# Patient Record
Sex: Male | Born: 1950 | Race: White | Hispanic: No | Marital: Married | Smoking: Current every day smoker
Health system: Southern US, Community
[De-identification: ages and names within clinical notes are randomized; demographics above are authoritative.]

## PROBLEM LIST (undated history)

## (undated) DIAGNOSIS — K219 Gastro-esophageal reflux disease without esophagitis: Secondary | ICD-10-CM

## (undated) DIAGNOSIS — F329 Major depressive disorder, single episode, unspecified: Secondary | ICD-10-CM

## (undated) DIAGNOSIS — F17200 Nicotine dependence, unspecified, uncomplicated: Secondary | ICD-10-CM

## (undated) DIAGNOSIS — I10 Essential (primary) hypertension: Secondary | ICD-10-CM

## (undated) HISTORY — DX: Major depressive disorder, single episode, unspecified: F32.9

## (undated) HISTORY — DX: Gastro-esophageal reflux disease without esophagitis: K21.9

## (undated) HISTORY — DX: Essential (primary) hypertension: I10

## (undated) HISTORY — DX: Nicotine dependence, unspecified, uncomplicated: F17.200

---

## 2001-11-09 LAB — HM SIGMOIDOSCOPY

## 2008-05-02 ENCOUNTER — Emergency Department (HOSPITAL_COMMUNITY): Admission: EM | Admit: 2008-05-02 | Discharge: 2008-05-02 | Payer: Self-pay | Admitting: Emergency Medicine

## 2010-02-05 ENCOUNTER — Ambulatory Visit: Payer: Self-pay | Admitting: Internal Medicine

## 2010-02-05 DIAGNOSIS — I10 Essential (primary) hypertension: Secondary | ICD-10-CM | POA: Insufficient documentation

## 2010-02-05 DIAGNOSIS — F329 Major depressive disorder, single episode, unspecified: Secondary | ICD-10-CM

## 2010-02-05 DIAGNOSIS — K219 Gastro-esophageal reflux disease without esophagitis: Secondary | ICD-10-CM | POA: Insufficient documentation

## 2010-02-05 DIAGNOSIS — F3341 Major depressive disorder, recurrent, in partial remission: Secondary | ICD-10-CM

## 2010-02-05 DIAGNOSIS — F3289 Other specified depressive episodes: Secondary | ICD-10-CM

## 2010-02-05 HISTORY — DX: Major depressive disorder, single episode, unspecified: F32.9

## 2010-02-05 HISTORY — DX: Gastro-esophageal reflux disease without esophagitis: K21.9

## 2010-02-05 HISTORY — DX: Other specified depressive episodes: F32.89

## 2010-02-05 HISTORY — DX: Essential (primary) hypertension: I10

## 2010-02-05 LAB — CONVERTED CEMR LAB
AST: 34 units/L (ref 0–37)
BUN: 15 mg/dL (ref 6–23)
Basophils Absolute: 0 10*3/uL (ref 0.0–0.1)
Cholesterol: 196 mg/dL (ref 0–200)
Eosinophils Absolute: 0.2 10*3/uL (ref 0.0–0.7)
GFR calc non Af Amer: 81.56 mL/min (ref >60)
Glucose, Bld: 80 mg/dL (ref 70–99)
HCT: 55.7 % — ABNORMAL HIGH (ref 39.0–52.0)
HDL: 35.8 mg/dL — ABNORMAL LOW (ref >39.00)
Lymphs Abs: 1.3 10*3/uL (ref 0.7–4.0)
MCHC: 33.1 g/dL (ref 30.0–36.0)
Monocytes Absolute: 0.6 10*3/uL (ref 0.1–1.0)
Monocytes Relative: 10.4 % (ref 3.0–12.0)
PSA: 1.87 ng/mL (ref 0.10–4.00)
Platelets: 200 10*3/uL (ref 150.0–400.0)
Potassium: 4.1 meq/L (ref 3.5–5.1)
RDW: 13.5 % (ref 11.5–14.6)
TSH: 0.74 microintl units/mL (ref 0.35–5.50)
Total Bilirubin: 0.8 mg/dL (ref 0.3–1.2)
VLDL: 32.2 mg/dL (ref 0.0–40.0)

## 2010-03-10 ENCOUNTER — Ambulatory Visit: Payer: Self-pay | Admitting: Internal Medicine

## 2010-08-06 ENCOUNTER — Telehealth: Payer: Self-pay | Admitting: Internal Medicine

## 2010-09-15 ENCOUNTER — Ambulatory Visit: Payer: Self-pay | Admitting: Internal Medicine

## 2010-09-15 DIAGNOSIS — F17200 Nicotine dependence, unspecified, uncomplicated: Secondary | ICD-10-CM | POA: Insufficient documentation

## 2010-09-15 HISTORY — DX: Nicotine dependence, unspecified, uncomplicated: F17.200

## 2010-12-09 NOTE — Assessment & Plan Note (Signed)
Summary: 6 month fup//ccm   Vital Signs:  Patient profile:   60 year old male Weight:      187 pounds Temp:     98.0 degrees F oral BP sitting:   112 / 80  (right arm) Cuff size:   regular  Vitals Entered By: Duard Brady LPN (September 15, 2010 3:49 PM) CC: 6 mos rov - doing well  **declines flu vaccine Is Patient Diabetic? No   CC:  6 mos rov - doing well  **declines flu vaccine.  History of Present Illness: 60 year old patient who is seen today for follow-up.  He has a history of hypertension, ongoing tobacco use, and depression.  He is doing quite well.  Laboratory studies were done last spring and revealed polycythemia.  He continues to smoke.  He is asymptomatic.  Denies any pulmonary  complains  Preventive Screening-Counseling & Management  Alcohol-Tobacco     Smoking Cessation Counseling: yes  Allergies: 1)  ! Compazine  Past History:  Past Medical History: Reviewed history from 02/05/2010 and no changes required. Depression GERD Hypertension  Family History: Reviewed history from 02/05/2010 and no changes required. father died age 60, probable acute MI, history of PAD mother, age 57 mild cognitive dysfunction one sisters in good health maternal grandmother history of colon cancer paternal grandfather history of coronary artery disease  Social History: Reviewed history from 02/05/2010 and no changes required. 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  No regular exercise program one half pack per day smoker  Review of Systems  The patient denies anorexia, fever, weight loss, weight gain, vision loss, decreased hearing, hoarseness, chest pain, syncope, dyspnea on exertion, peripheral edema, prolonged cough, headaches, hemoptysis, abdominal pain, melena, hematochezia, severe indigestion/heartburn, hematuria, incontinence, genital sores, muscle weakness, suspicious skin lesions, transient  blindness, difficulty walking, depression, unusual weight change, abnormal bleeding, enlarged lymph nodes, angioedema, breast masses, and testicular masses.    Physical Exam  General:  Well-developed,well-nourished,in no acute distress; alert,appropriate and cooperative throughout examination Head:  Normocephalic and atraumatic without obvious abnormalities. No apparent alopecia or balding. Eyes:  No corneal or conjunctival inflammation noted. EOMI. Perrla. Funduscopic exam benign, without hemorrhages, exudates or papilledema. Vision grossly normal. Mouth:  Oral mucosa and oropharynx without lesions or exudates.  Teeth in good repair. Neck:  No deformities, masses, or tenderness noted. Lungs:  Normal respiratory effort, chest expands symmetrically. Lungs are clear to auscultation, no crackles or wheezes. Heart:  Normal rate and regular rhythm. S1 and S2 normal without gallop, murmur, click, rub or other extra sounds. Abdomen:  Bowel sounds positive,abdomen soft and non-tender without masses, organomegaly or hernias noted. Msk:  No deformity or scoliosis noted of thoracic or lumbar spine.   Pulses:  R and L carotid,radial,femoral,dorsalis pedis and posterior tibial pulses are full and equal bilaterally Extremities:  No clubbing, cyanosis, edema, or deformity noted with normal full range of motion of all joints.     Impression & Recommendations:  Problem # 1:  HYPERTENSION (ICD-401.9)  His updated medication list for this problem includes:    Lisinopril-hydrochlorothiazide 20-25 Mg Tabs (Lisinopril-hydrochlorothiazide) ..... Qd    Atenolol 50 Mg Tabs (Atenolol) .Marland Kitchen... 2 qd  His updated medication list for this problem includes:    Lisinopril-hydrochlorothiazide 20-25 Mg Tabs (Lisinopril-hydrochlorothiazide) ..... Qd    Atenolol 50 Mg Tabs (Atenolol) .Marland Kitchen... 2 qd  Problem # 2:  GERD (ICD-530.81)  His updated medication list for this problem includes:    Omeprazole  20 Mg Cpdr (Omeprazole)  ..... One daily  His updated medication list for this problem includes:    Omeprazole 20 Mg Cpdr (Omeprazole) ..... One daily  Problem # 3:  DEPRESSION (ICD-311)  His updated medication list for this problem includes:    Sertraline Hcl 50 Mg Tabs (Sertraline hcl) ..... Qd  His updated medication list for this problem includes:    Sertraline Hcl 50 Mg Tabs (Sertraline hcl) ..... Qd  Problem # 4:  TOBACCO ABUSE (ICD-305.1)  Complete Medication List: 1)  Lisinopril-hydrochlorothiazide 20-25 Mg Tabs (Lisinopril-hydrochlorothiazide) .... Qd 2)  Atenolol 50 Mg Tabs (Atenolol) .... 2 qd 3)  Sertraline Hcl 50 Mg Tabs (Sertraline hcl) .... Qd 4)  Omeprazole 20 Mg Cpdr (Omeprazole) .... One daily  Patient Instructions: 1)  Please schedule a follow-up appointment in 6 months for annual exam 2)  Limit your Sodium (Salt). 3)  Tobacco is very bad for your health and your loved ones! You Should stop smoking!. 4)  It is important that you exercise regularly at least 20 minutes 5 times a week. If you develop chest pain, have severe difficulty breathing, or feel very tired , stop exercising immediately and seek medical attention. Prescriptions: OMEPRAZOLE 20 MG CPDR (OMEPRAZOLE) one daily  #90 x 4   Entered and Authorized by:   Gordy Savers  MD   Signed by:   Gordy Savers  MD on 09/15/2010   Method used:   Electronically to        [facility] (retail)       [address] [place], [place]  [address]       Ph: [phone]       Fax: [phone]   RxID:   [id number] SERTRALINE HCL 50 MG TABS (SERTRALINE HCL) qd  #90 x 4   Entered and Authorized by:   Gordy Savers  MD   Signed by:   Gordy Savers  MD on 09/15/2010   Method used:   Electronically to        [facility] (retail)       [address]       [place], [place]  [address]       Ph: [phone]       Fax: [phone]   RxID:   [id number] ATENOLOL 50 MG TABS (ATENOLOL) 2 qd  #90  x 4   Entered and Authorized by:   Gordy Savers  MD   Signed by:   Gordy Savers  MD on 09/15/2010   Method used:   Electronically to        [facility] (ret[facility])       [address]       [place], [place]  [address]       Ph: [phone]       Fax: [phone]   RxID:   [id number] LISINOPRIL-HYDROCHLOROTHIAZIDE 20-25 MG TABS (LISINOPRIL-HYDROCHLOROTHIAZIDE) qd  #90 x 4   Entered and Authorized by:   Gordy Savers  MD   Signed by:   Gordy Savers  MD on 09/15/2010   Method used:   Electronically to        [facility] (ret[facility])       [address] [place], [place]  [address]       Ph: [phone]  Fax: [phone]   RxID:   [id number]    Orders Added: 1)  Est. Patient Level III [[address]]

## 2010-12-09 NOTE — Assessment & Plan Note (Signed)
Summary: 1 MONTH F/U .Marland KitchenALP   Vital Signs:  Patient profile:   60 year old male Weight:      189 pounds Temp:     98.2 degrees F oral BP sitting:   110 / 68  (right arm) Cuff size:   regular  Vitals Entered By: Duard Brady LPN (Mar 11, 2951 4:09 PM) CC: rov - f/u Bp - much better Is Patient Diabetic? No   CC:  rov - f/u Bp - much better.  History of Present Illness: 60 year old patient who is in today for follow-up of his hypertension.  He was resumed on his medications one month ago and feels quite well.  Today.  No concerns or complaints.  He has a history of depression, which has been well-controlled on sertraline.  He has gastroesophageal  reflux disease controlled on omeprazole  Allergies: 1)  ! Compazine  Past History:  Past Medical History: Reviewed history from 02/05/2010 and no changes required. Depression GERD Hypertension  Physical Exam  General:  Well-developed,well-nourished,in no acute distress; alert,appropriate and cooperative throughout examination; blood pressure low normal range   Impression & Recommendations:  Problem # 1:  HYPERTENSION (ICD-401.9)  His updated medication list for this problem includes:    Lisinopril-hydrochlorothiazide 20-25 Mg Tabs (Lisinopril-hydrochlorothiazide) ..... Qd    Atenolol 50 Mg Tabs (Atenolol) .Marland Kitchen... 2 qd  Complete Medication List: 1)  Lisinopril-hydrochlorothiazide 20-25 Mg Tabs (Lisinopril-hydrochlorothiazide) .... Qd 2)  Atenolol 50 Mg Tabs (Atenolol) .... 2 qd 3)  Sertraline Hcl 50 Mg Tabs (Sertraline hcl) .... Qd 4)  Omeprazole 20 Mg Cpdr (Omeprazole) .... One daily  Patient Instructions: 1)  Limit your Sodium (Salt). 2)  It is important that you exercise regularly at least 20 minutes 5 times a week. If you develop chest pain, have severe difficulty breathing, or feel very tired , stop exercising immediately and seek medical attention. 3)  Check your Blood Pressure regularly. If it is above: 160/90 you  should make an appointment. 4)  Please schedule a follow-up appointment in 6 months.

## 2010-12-09 NOTE — Progress Notes (Signed)
Summary: refill atenolol  Phone Note Refill Request Message from:  Fax from Pharmacy on August 06, 2010 10:49 AM  Refills Requested: Medication #1:  ATENOLOL 50 MG TABS 2 qd [facility]   Method Requested: Fax to Local Pharmacy Initial call taken by: Duard Brady LPN,  August 06, 2010 10:50 AM    Prescriptions: ATENOLOL 50 MG TABS (ATENOLOL) 2 qd  #90 x 2   Entered by:   Duard Brady LPN   Authorized by:   Gordy Savers  MD   Signed by:   Duard Brady LPN on 09/81/1914   Method used:   Faxed to ...       [facility] ([facility])       [address] [place], [place]  [address]       Ph: [phone]       Fax: [phone]   RxID:   [id number]

## 2010-12-09 NOTE — Assessment & Plan Note (Signed)
Summary: NEW PT EST // RS   Vital Signs:  Patient profile:   60 year old male Height:      69.5 inches Weight:      195 pounds BMI:     28.49 Temp:     97.9 degrees F oral BP sitting:   190 / 100  (right arm) Cuff size:   regular  Vitals Entered By: Duard Brady LPN (February 05, 2010 8:51 AM) CC: new tp establish - needs refills on all meds Is Patient Diabetic? No   CC:  new tp establish - needs refills on all meds.  History of Present Illness: 60 year old patient who is seen today to establish with our practice;  he has a greater than 15 year history of hypertension and has been off of his medications.  he has a history of depression, which has been stable.  He is a one half pack per day smoker.  He denies any cardiopulmonary complaints.  Since being off his blood pressure medications, he  has had some pedal edema.  Denies any shortness of breath or dyspnea on exertion.  He has what sounds like a screening sigmoidoscopy about 8 years ago. He has a history of gastroesophageal reflux disease, which is controlled well with OTC Prilosec  Preventive Screening-Counseling & Management  Alcohol-Tobacco     Smoking Status: current     Smoking Cessation Counseling: yes  Allergies (verified): 1)  ! Compazine  Past History:  Past Medical History: Depression GERD Hypertension  Past Surgical History: none screening sigmoidoscopy at age 58  Family History: Reviewed history and no changes required. father died age 52, probable acute MI, history of PAD mother, age 57 mild cognitive dysfunction one sisters in good health maternal grandmother history of colon cancer paternal grandfather history of coronary artery disease  Social History: Reviewed history and no changes required. 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  No regular exercise program one half pack per day smokerSmoking Status:  current  Review of  Systems  The patient denies anorexia, fever, weight loss, weight gain, vision loss, decreased hearing, hoarseness, chest pain, syncope, dyspnea on exertion, peripheral edema, prolonged cough, headaches, hemoptysis, abdominal pain, melena, hematochezia, severe indigestion/heartburn, hematuria, incontinence, genital sores, muscle weakness, suspicious skin lesions, transient blindness, difficulty walking, depression, unusual weight change, abnormal bleeding, enlarged lymph nodes, angioedema, breast masses, and testicular masses.    Physical Exam  General:  Well-developed,well-nourished,in no acute distress; alert,appropriate and cooperative throughout examination; 190/100 Head:  Normocephalic and atraumatic without obvious abnormalities. No apparent alopecia or balding. Eyes:  No corneal or conjunctival inflammation noted. EOMI. Perrla. Funduscopic exam benign, without hemorrhages, exudates or papilledema. Vision grossly normal. Ears:  External ear exam shows no significant lesions or deformities.  Otoscopic examination reveals clear canals, tympanic membranes are intact bilaterally without bulging, retraction, inflammation or discharge. Hearing is grossly normal bilaterally. Nose:  External nasal examination shows no deformity or inflammation. Nasal mucosa are pink and moist without lesions or exudates. Mouth:  Oral mucosa and oropharynx without lesions or exudates.  Teeth in good repair. Neck:  No deformities, masses, or tenderness noted. Chest Wall:  No deformities, masses, tenderness or gynecomastia noted. Breasts:  No masses or gynecomastia noted Lungs:  Normal respiratory effort, chest expands symmetrically. Lungs are clear to auscultation, no crackles or wheezes. Heart:  Normal rate and regular rhythm. S1 and S2 normal without gallop, murmur, click, rub or other extra sounds. Abdomen:  Bowel  sounds positive,abdomen soft and non-tender without masses, organomegaly or hernias noted. Rectal:   perianal pedunculated lesion, consistent with a wart Genitalia:  Testes bilaterally descended without nodularity, tenderness or masses. No scrotal masses or lesions. No penis lesions or urethral discharge. Prostate:  2+ enlarged.   Msk:  No deformity or scoliosis noted of thoracic or lumbar spine.   Pulses:  R and L carotid,radial,femoral,dorsalis pedis and posterior tibial pulses are full and equal bilaterally Extremities:  2+ left pedal edema and 2+ right pedal edema.   Neurologic:  No cranial nerve deficits noted. Station and gait are normal. Plantar reflexes are down-going bilaterally. DTRs are symmetrical throughout. Sensory, motor and coordinative functions appear intact. Skin:  Intact without suspicious lesions or rashes Cervical Nodes:  No lymphadenopathy noted Axillary Nodes:  No palpable lymphadenopathy Inguinal Nodes:  No significant adenopathy Psych:  Cognition and judgment appear intact. Alert and cooperative with normal attention span and concentration. No apparent delusions, illusions, hallucinations   Impression & Recommendations:  Problem # 1:  HYPERTENSION (ICD-401.9)  His updated medication list for this problem includes:    Lisinopril-hydrochlorothiazide 20-25 Mg Tabs (Lisinopril-hydrochlorothiazide) ..... Qd    Atenolol 50 Mg Tabs (Atenolol) .Marland Kitchen... 2 qd  Orders: Venipuncture (91478) TLB-Lipid Panel (80061-LIPID) TLB-BMP (Basic Metabolic Panel-BMET) (80048-METABOL) TLB-CBC Platelet - w/Differential (85025-CBCD) TLB-Hepatic/Liver Function Pnl (80076-HEPATIC) TLB-TSH (Thyroid Stimulating Hormone) (84443-TSH) TLB-PSA (Prostate Specific Antigen) (84153-PSA)  Problem # 2:  GERD (ICD-530.81)  Orders: Venipuncture (29562) TLB-Lipid Panel (80061-LIPID) TLB-BMP (Basic Metabolic Panel-BMET) (80048-METABOL) TLB-CBC Platelet - w/Differential (85025-CBCD) TLB-Hepatic/Liver Function Pnl (80076-HEPATIC) TLB-TSH (Thyroid Stimulating Hormone) (84443-TSH) TLB-PSA (Prostate  Specific Antigen) (84153-PSA)  His updated medication list for this problem includes:    Omeprazole 20 Mg Cpdr (Omeprazole) ..... One daily  Problem # 3:  DEPRESSION (ICD-311)  His updated medication list for this problem includes:    Sertraline Hcl 50 Mg Tabs (Sertraline hcl) ..... Qd  Orders: Venipuncture (13086) TLB-Lipid Panel (80061-LIPID) TLB-BMP (Basic Metabolic Panel-BMET) (80048-METABOL) TLB-CBC Platelet - w/Differential (85025-CBCD) TLB-Hepatic/Liver Function Pnl (80076-HEPATIC) TLB-TSH (Thyroid Stimulating Hormone) (84443-TSH) TLB-PSA (Prostate Specific Antigen) (84153-PSA)  Complete Medication List: 1)  Lisinopril-hydrochlorothiazide 20-25 Mg Tabs (Lisinopril-hydrochlorothiazide) .... Qd 2)  Atenolol 50 Mg Tabs (Atenolol) .... 2 qd 3)  Sertraline Hcl 50 Mg Tabs (Sertraline hcl) .... Qd 4)  Omeprazole 20 Mg Cpdr (Omeprazole) .... One daily  Patient Instructions: 1)  Please schedule a follow-up appointment in 1 month. 2)  Limit your Sodium (Salt). 3)  Tobacco is very bad for your health and your loved ones! You Should stop smoking!. 4)  It is important that you exercise regularly at least 20 minutes 5 times a week. If you develop chest pain, have severe difficulty breathing, or feel very tired , stop exercising immediately and seek medical attention. 5)  Check your Blood Pressure regularly. If it is above: 150/90  you should make an appointment. Prescriptions: OMEPRAZOLE 20 MG CPDR (OMEPRAZOLE) one daily  #90 x 3   Entered and Authorized by:   Gordy Savers  MD   Signed by:   Gordy Savers  MD on 02/05/2010   Method used:   Print then Give to Patient   RxID:   [id number] SERTRALINE HCL 50 MG TABS (SERTRALINE HCL) qd  #90 x 2   Entered and Authorized by:   Gordy Savers  MD   Signed by:   Gordy Savers  MD on 02/05/2010   Method used:   Print then Give to Patient   RxID:  [id number] ATENOLOL 50 MG TABS (ATENOLOL) 2 qd  #90 x 2    Entered and Authorized by:   Gordy Savers  MD   Signed by:   Gordy Savers  MD on 02/05/2010   Method used:   Print then Give to Patient   RxID:   [id number] LISINOPRIL-HYDROCHLOROTHIAZIDE 20-25 MG TABS (LISINOPRIL-HYDROCHLOROTHIAZIDE) qd  #90 x 2   Entered and Authorized by:   Gordy Savers  MD   Signed by:   Gordy Savers  MD on 02/05/2010   Method used:   Print then Give to Patient   RxID:   [id number]

## 2011-03-12 ENCOUNTER — Encounter: Payer: Self-pay | Admitting: Internal Medicine

## 2011-03-16 ENCOUNTER — Ambulatory Visit (INDEPENDENT_AMBULATORY_CARE_PROVIDER_SITE_OTHER): Payer: BC Managed Care – PPO | Admitting: Internal Medicine

## 2011-03-16 ENCOUNTER — Encounter: Payer: Self-pay | Admitting: Internal Medicine

## 2011-03-16 DIAGNOSIS — K219 Gastro-esophageal reflux disease without esophagitis: Secondary | ICD-10-CM

## 2011-03-16 DIAGNOSIS — Z13220 Encounter for screening for lipoid disorders: Secondary | ICD-10-CM

## 2011-03-16 DIAGNOSIS — I10 Essential (primary) hypertension: Secondary | ICD-10-CM

## 2011-03-16 DIAGNOSIS — F17200 Nicotine dependence, unspecified, uncomplicated: Secondary | ICD-10-CM

## 2011-03-16 DIAGNOSIS — F329 Major depressive disorder, single episode, unspecified: Secondary | ICD-10-CM

## 2011-03-16 DIAGNOSIS — Z0000 Encounter for general adult medical examination without abnormal findings: Secondary | ICD-10-CM

## 2011-03-16 DIAGNOSIS — F3289 Other specified depressive episodes: Secondary | ICD-10-CM

## 2011-03-16 LAB — LIPID PANEL
Cholesterol: 223 mg/dL — ABNORMAL HIGH (ref 0–200)
Triglycerides: 230 mg/dL — ABNORMAL HIGH (ref 0.0–149.0)

## 2011-03-16 LAB — CBC WITH DIFFERENTIAL/PLATELET
Basophils Relative: 0.4 % (ref 0.0–3.0)
Eosinophils Relative: 2.4 % (ref 0.0–5.0)
Lymphocytes Relative: 21.6 % (ref 12.0–46.0)
Monocytes Relative: 8.3 % (ref 3.0–12.0)
Neutrophils Relative %: 67.3 % (ref 43.0–77.0)
Platelets: 248 10*3/uL (ref 150.0–400.0)
RBC: 5.22 Mil/uL (ref 4.22–5.81)
WBC: 7.6 10*3/uL (ref 4.5–10.5)

## 2011-03-16 LAB — BASIC METABOLIC PANEL
BUN: 15 mg/dL (ref 6–23)
CO2: 30 mEq/L (ref 19–32)
Chloride: 98 mEq/L (ref 96–112)
Creatinine, Ser: 0.9 mg/dL (ref 0.4–1.5)

## 2011-03-16 LAB — HEPATIC FUNCTION PANEL
ALT: 18 U/L (ref 0–53)
Total Bilirubin: 0.7 mg/dL (ref 0.3–1.2)
Total Protein: 7.2 g/dL (ref 6.0–8.3)

## 2011-03-16 MED ORDER — OMEPRAZOLE 20 MG PO CPDR: 20.0000 mg | DELAYED_RELEASE_CAPSULE | Freq: Every day | ORAL | Status: DC

## 2011-03-16 MED ORDER — ATENOLOL 50 MG PO TABS: 100.0000 mg | ORAL_TABLET | Freq: Every day | ORAL | Status: DC

## 2011-03-16 MED ORDER — SERTRALINE HCL 50 MG PO TABS: 50.0000 mg | ORAL_TABLET | Freq: Every day | ORAL | Status: DC

## 2011-03-16 MED ORDER — LISINOPRIL-HYDROCHLOROTHIAZIDE 20-25 MG PO TABS: 1.0000 | ORAL_TABLET | Freq: Every day | ORAL | Status: DC

## 2011-03-16 NOTE — Patient Instructions (Signed)
Limit your sodium (Salt) intake  Avoids foods high in acid such as tomatoes citrus juices, and spicy foods.  Avoid eating within two hours of lying down or before exercising.  Do not overheat.  Try smaller more frequent meals.  If symptoms persist, elevate the head of her bed 12 inches while sleeping.    It is important that you exercise regularly, at least 20 minutes 3 to 4 times per week.  If you develop chest pain or shortness of breath seek  medical attention.  Please check your blood pressure on a regular basis.  If it is consistently greater than 150/90, please make an office appointment.  Smoking tobacco is very bad for your health. You should stop smoking immediately.  Return in one year for follow-up

## 2011-03-16 NOTE — Progress Notes (Signed)
  Subjective:    Patient ID: Ryan Daniel, male    DOB: [birth year], 60 y.o.   MRN: [id number]  HPI  60 year old patient who is seen today in for annual physical. He has a history of hypertension tobacco use and depression. His depression has been well controlled on sertraline 50 mg daily he has gastroesophageal reflux disease. No concerns or complaints today    Review of Systems  Constitutional: Negative for fever, chills, activity change, appetite change and fatigue.  HENT: Negative for hearing loss, ear pain, congestion, rhinorrhea, sneezing, mouth sores, trouble swallowing, neck pain, neck stiffness, dental problem, voice change, sinus pressure and tinnitus.   Eyes: Negative for photophobia, pain, redness and visual disturbance.  Respiratory: Negative for apnea, cough, choking, chest tightness, shortness of breath and wheezing.   Cardiovascular: Negative for chest pain, palpitations and leg swelling.  Gastrointestinal: Negative for nausea, vomiting, abdominal pain, diarrhea, constipation, blood in stool, abdominal distention, anal bleeding and rectal pain.  Genitourinary: Negative for dysuria, urgency, frequency, hematuria, flank pain, decreased urine volume, discharge, penile swelling, scrotal swelling, difficulty urinating, genital sores and testicular pain.  Musculoskeletal: Negative for myalgias, back pain, joint swelling, arthralgias and gait problem.  Skin: Negative for color change, rash and wound.  Neurological: Negative for dizziness, tremors, seizures, syncope, facial asymmetry, speech difficulty, weakness, light-headedness, numbness and headaches.  Hematological: Negative for adenopathy. Does not bruise/bleed easily.  Psychiatric/Behavioral: Negative for suicidal ideas, hallucinations, behavioral problems, confusion, sleep disturbance, self-injury, dysphoric mood, decreased concentration and agitation. The patient is not nervous/anxious.        Objective:   Physical Exam    Constitutional: He is oriented to person, place, and time. He appears well-developed.  HENT:  Head: Normocephalic.  Right Ear: External ear normal.  Left Ear: External ear normal.  Eyes: Conjunctivae and EOM are normal.  Neck: Normal range of motion.  Cardiovascular: Normal rate and normal heart sounds.   Pulmonary/Chest: Breath sounds normal.  Abdominal: Bowel sounds are normal.  Genitourinary: Rectum normal, prostate normal and penis normal. Guaiac negative stool. No penile tenderness.       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 He exhibits no edema and no tenderness.  Neurological: He is alert and oriented to person, place, and time.  Skin: Skin is warm and dry.       Sebaceous cyst noted involving his left lower back region  Psychiatric: He has a normal mood and affect. His behavior is normal.          Assessment & Plan:   Annual physical exam Hypertension stable Depression well controlled Gastroesophageal reflux disease Tobacco use. Total tobacco cessation encouraged

## 2011-09-17 ENCOUNTER — Other Ambulatory Visit: Payer: Self-pay | Admitting: Internal Medicine

## 2011-09-18 ENCOUNTER — Telehealth: Payer: Self-pay | Admitting: Internal Medicine

## 2011-09-18 NOTE — Telephone Encounter (Signed)
done and pt aware

## 2011-09-18 NOTE — Telephone Encounter (Signed)
Last seen 03/16/11 - please advise

## 2011-09-18 NOTE — Telephone Encounter (Signed)
ok 

## 2011-09-18 NOTE — Telephone Encounter (Signed)
Pt has been out of work since Wed 09/16/11, due to a sprained ankle. Pt is req Dr Amador Cunas to write a note to pts work, stating that it is ok for pt to return back to work on Monday 09/21/11. Pt is req to pick up letter today.

## 2011-10-12 ENCOUNTER — Other Ambulatory Visit: Payer: Self-pay

## 2011-10-12 MED ORDER — ATENOLOL 50 MG PO TABS: 100.0000 mg | ORAL_TABLET | Freq: Every day | ORAL | Status: DC

## 2012-02-03 ENCOUNTER — Other Ambulatory Visit: Payer: Self-pay | Admitting: Internal Medicine

## 2012-03-29 ENCOUNTER — Other Ambulatory Visit: Payer: Self-pay

## 2012-03-29 MED ORDER — SERTRALINE HCL 50 MG PO TABS: 50.0000 mg | ORAL_TABLET | Freq: Every day | ORAL | Status: DC

## 2012-06-29 ENCOUNTER — Other Ambulatory Visit: Payer: Self-pay | Admitting: Internal Medicine

## 2012-06-30 NOTE — Telephone Encounter (Signed)
ok 

## 2012-06-30 NOTE — Telephone Encounter (Signed)
Pt last seen 03/2011.  Rx last filled 03/29/12 #60.  No upcoming appts noted.

## 2012-07-01 NOTE — Telephone Encounter (Signed)
Rx called in to pharmacy #60 w/ 0rf.

## 2012-08-31 ENCOUNTER — Other Ambulatory Visit: Payer: Self-pay | Admitting: Internal Medicine

## 2012-09-21 ENCOUNTER — Ambulatory Visit (INDEPENDENT_AMBULATORY_CARE_PROVIDER_SITE_OTHER): Payer: Self-pay | Admitting: Internal Medicine

## 2012-09-21 ENCOUNTER — Encounter: Payer: Self-pay | Admitting: Internal Medicine

## 2012-09-21 VITALS — BP 160/90 | HR 84 | Temp 98.3°F | Resp 18 | Wt 191.0 lb

## 2012-09-21 DIAGNOSIS — I10 Essential (primary) hypertension: Secondary | ICD-10-CM

## 2012-09-21 DIAGNOSIS — F329 Major depressive disorder, single episode, unspecified: Secondary | ICD-10-CM

## 2012-09-21 DIAGNOSIS — F17200 Nicotine dependence, unspecified, uncomplicated: Secondary | ICD-10-CM

## 2012-09-21 DIAGNOSIS — K219 Gastro-esophageal reflux disease without esophagitis: Secondary | ICD-10-CM

## 2012-09-21 MED ORDER — ATENOLOL 100 MG PO TABS: 100.0000 mg | ORAL_TABLET | Freq: Every day | ORAL | Status: DC

## 2012-09-21 MED ORDER — SERTRALINE HCL 50 MG PO TABS: 50.0000 mg | ORAL_TABLET | Freq: Every day | ORAL | Status: DC

## 2012-09-21 MED ORDER — LISINOPRIL-HYDROCHLOROTHIAZIDE 20-25 MG PO TABS: 1.0000 | ORAL_TABLET | Freq: Every day | ORAL | Status: DC

## 2012-09-21 MED ORDER — OMEPRAZOLE 20 MG PO CPDR: 20.0000 mg | DELAYED_RELEASE_CAPSULE | Freq: Every day | ORAL | Status: DC

## 2012-09-21 NOTE — Progress Notes (Signed)
  Subjective:    Patient ID: Ryan Daniel, male    DOB: 1950/12/16, 61 y.o.   MRN: [id number]  HPI  61 year old patient who has a history of treated hypertension. He has not been seen here in approximately 18 months. In January of this year he underwent a cholecystectomy. Apparently a bowel perforation was a complication of the procedure and he underwent exploratory laparotomy and a temporary colostomy. He was readmitted with a small bowel obstruction was treated medically and later was admitted for a colostomy reversal.  Earlier in the year blood pressure regimen has been down titrated he formally had been on atenolol 100 mg daily and hydrochlorothiazide was part of his regimen along with lisinopril. He has been on 50 mg of atenolol and hydrochlorothiazide has been discontinued. Since his last surgery there is been no weight gain his blood pressure has been consistently elevated on arrival today was in the 160/90 range  Wt Readings from Last 3 Encounters:  09/21/12 191 lb (86.637 kg)  03/16/11 184 lb (83.462 kg)  09/15/10 187 lb (84.823 kg)    Review of Systems  Constitutional: Negative for fever, chills, appetite change and fatigue.  HENT: Negative for hearing loss, ear pain, congestion, sore throat, trouble swallowing, neck stiffness, dental problem, voice change and tinnitus.   Eyes: Negative for pain, discharge and visual disturbance.  Respiratory: Negative for cough, chest tightness, wheezing and stridor.   Cardiovascular: Negative for chest pain, palpitations and leg swelling.  Gastrointestinal: Negative for nausea, vomiting, abdominal pain, diarrhea, constipation, blood in stool and abdominal distention.  Genitourinary: Negative for urgency, hematuria, flank pain, discharge, difficulty urinating and genital sores.  Musculoskeletal: Negative for myalgias, back pain, joint swelling, arthralgias and gait problem.  Skin: Negative for rash.  Neurological: Negative for dizziness, syncope,  speech difficulty, weakness, numbness and headaches.  Hematological: Negative for adenopathy. Does not bruise/bleed easily.  Psychiatric/Behavioral: Negative for behavioral problems and dysphoric mood. The patient is not nervous/anxious.        Objective:   Physical Exam  Constitutional: He is oriented to person, place, and time. He appears well-developed.       Blood pressure 160/90  HENT:  Head: Normocephalic.  Right Ear: External ear normal.  Left Ear: External ear normal.  Eyes: Conjunctivae normal and EOM are normal.  Neck: Normal range of motion.  Cardiovascular: Normal rate and normal heart sounds.   Pulmonary/Chest: Breath sounds normal.  Abdominal: Soft. Bowel sounds are normal. He exhibits no distension. There is no tenderness. There is no rebound and no guarding.       Large midline scar  Musculoskeletal: Normal range of motion. He exhibits no edema and no tenderness.  Neurological: He is alert and oriented to person, place, and time.  Psychiatric: He has a normal mood and affect. His behavior is normal.          Assessment & Plan:   Hypertension suboptimal control. We'll resume her prior regimen of atenolol 100 and resume hydrochlorothiazide. New prescriptions dispensed Status post cholecystectomy Status post diverting colostomy with reversal Depression stable. Medications refilled  CPX 6 months

## 2012-09-21 NOTE — Patient Instructions (Addendum)
Limit your sodium (Salt) intake  Please check your blood pressure on a regular basis.  If it is consistently greater than 150/90, please make an office appointment.    It is important that you exercise regularly, at least 20 minutes 3 to 4 times per week.  If you develop chest pain or shortness of breath seek  medical attention.  Return in 6 months for follow-up  

## 2013-03-21 ENCOUNTER — Encounter: Payer: Self-pay | Admitting: Internal Medicine

## 2013-03-21 DIAGNOSIS — Z0289 Encounter for other administrative examinations: Secondary | ICD-10-CM

## 2013-10-02 ENCOUNTER — Other Ambulatory Visit: Payer: Self-pay | Admitting: Internal Medicine

## 2014-02-03 ENCOUNTER — Other Ambulatory Visit: Payer: Self-pay | Admitting: Internal Medicine

## 2014-02-06 ENCOUNTER — Telehealth: Payer: Self-pay | Admitting: Internal Medicine

## 2014-02-06 MED ORDER — ATENOLOL 100 MG PO TABS: ORAL_TABLET | ORAL | Status: DC

## 2014-02-06 MED ORDER — LISINOPRIL-HYDROCHLOROTHIAZIDE 20-25 MG PO TABS: ORAL_TABLET | ORAL | Status: DC

## 2014-02-06 MED ORDER — SERTRALINE HCL 50 MG PO TABS: ORAL_TABLET | ORAL | Status: DC

## 2014-02-06 NOTE — Telephone Encounter (Signed)
Rx's sent 30 day supply only till appointment.

## 2014-02-06 NOTE — Telephone Encounter (Signed)
lisinopril-hydrochlorothiazide (PRINZIDE,ZESTORETIC) 20-25 MG per tablet sertraline (ZOLOFT) 50 MG tablet atenolol (TENORMIN) 100 MG tablet  Pt needs a re-fill on medication approved till his 02/13/14 appointment. Kristopher Oppenheim in [place]

## 2014-02-13 ENCOUNTER — Encounter: Payer: Self-pay | Admitting: Internal Medicine

## 2014-02-13 ENCOUNTER — Ambulatory Visit (INDEPENDENT_AMBULATORY_CARE_PROVIDER_SITE_OTHER): Payer: Self-pay | Admitting: Internal Medicine

## 2014-02-13 VITALS — BP 130/82 | HR 76 | Temp 98.7°F | Resp 20 | Ht 70.0 in | Wt 208.0 lb

## 2014-02-13 DIAGNOSIS — F17200 Nicotine dependence, unspecified, uncomplicated: Secondary | ICD-10-CM

## 2014-02-13 DIAGNOSIS — F329 Major depressive disorder, single episode, unspecified: Secondary | ICD-10-CM

## 2014-02-13 DIAGNOSIS — I10 Essential (primary) hypertension: Secondary | ICD-10-CM

## 2014-02-13 DIAGNOSIS — F3289 Other specified depressive episodes: Secondary | ICD-10-CM

## 2014-02-13 MED ORDER — LISINOPRIL-HYDROCHLOROTHIAZIDE 20-25 MG PO TABS: ORAL_TABLET | ORAL | Status: DC

## 2014-02-13 MED ORDER — ATENOLOL 100 MG PO TABS: ORAL_TABLET | ORAL | Status: DC

## 2014-02-13 MED ORDER — SERTRALINE HCL 50 MG PO TABS: ORAL_TABLET | ORAL | Status: DC

## 2014-02-13 NOTE — Progress Notes (Signed)
Pre-visit discussion using our clinic review tool. No additional management support is needed unless otherwise documented below in the visit note.  

## 2014-02-13 NOTE — Patient Instructions (Signed)
Limit your sodium (Salt) intake  Please check your blood pressure on a regular basis.  If it is consistently greater than 150/90, please make an office appointment.  Return in 6 months for follow-up  Followup  Gen. surgery in [place]

## 2014-02-13 NOTE — Progress Notes (Signed)
Subjective:    Patient ID: Ryan Daniel, male    DOB: 1951/03/25, 63 y.o.   MRN: [id number]  HPI  63 year old patient who has a history of treated hypertension, depression, and ongoing tobacco use.  He was last seen here in November of 2013.  Shortly after that visit.  He was hospitalized in [place] for acute cholecystitis.  Unfortunately, his cholecystectomy was complicated by a bowel perforation.  That resulted in sepsis and a temporary diverging colostomy.  He was also referred back to [facility] due to the severity of his illness.  Subsequently, he has had a reversal. For the past week.  He describes a mucoid rectal discharge.  He believes he probably had a colonoscopy around the time of his acute illness.  These records are not available for review  Past Medical History  Diagnosis Date  . DEPRESSION 02/05/2010  . GERD 02/05/2010  . HYPERTENSION 02/05/2010  . TOBACCO ABUSE 09/15/2010    History   Social History  . Marital Status: Single    Spouse Name: N/A    Number of Children: N/A  . Years of Education: N/A   Occupational History  . Not on file.   Social History Main Topics  . Smoking status: Current Every Day Smoker -- 0.50 packs/day    Types: Cigarettes  . Smokeless tobacco: Never Used  . Alcohol Use: 3.5 oz/week    7 drink(s) per week  . Drug Use: No  . Sexual Activity: Not on file   Other Topics Concern  . Not on file   Social History Narrative  . No narrative on file    History reviewed. No pertinent past surgical history.  Family History  Problem Relation Age of Onset  . Cancer Maternal Grandmother     colon  . Heart disease Paternal Grandfather     Allergies  Allergen Reactions  . Prochlorperazine Edisylate     Current Outpatient Prescriptions on File Prior to Visit  Medication Sig Dispense Refill  . omeprazole (PRILOSEC) 20 MG capsule Take 1 capsule (20 mg total) by mouth daily.  90 capsule  4   No current facility-administered  medications on file prior to visit.    BP 130/82  Pulse 76  Temp(Src) 98.7 F (37.1 C) (Oral)  Resp 20  Ht 5\' 10"  (1.778 m)  Wt 208 lb (94.348 kg)  BMI 29.84 kg/m2  SpO2 97%       Review of Systems  Constitutional: Negative for fever, chills, appetite change and fatigue.  HENT: Negative for congestion, dental problem, ear pain, hearing loss, sore throat, tinnitus, trouble swallowing and voice change.   Eyes: Negative for pain, discharge and visual disturbance.  Respiratory: Negative for cough, chest tightness, wheezing and stridor.   Cardiovascular: Negative for chest pain, palpitations and leg swelling.  Gastrointestinal: Negative for nausea, vomiting, abdominal pain, diarrhea, constipation, blood in stool and abdominal distention.  Genitourinary: Negative for urgency, hematuria, flank pain, discharge, difficulty urinating and genital sores.  Musculoskeletal: Negative for arthralgias, back pain, gait problem, joint swelling, myalgias and neck stiffness.  Skin: Negative for rash.  Neurological: Negative for dizziness, syncope, speech difficulty, weakness, numbness and headaches.  Hematological: Negative for adenopathy. Does not bruise/bleed easily.  Psychiatric/Behavioral: Negative for behavioral problems and dysphoric mood. The patient is not nervous/anxious.        Objective:   Physical Exam  Constitutional: He is oriented to person, place, and time. He appears well-developed.  HENT:  Head: Normocephalic.  Right Ear: External  ear normal.  Left Ear: External ear normal.  Eyes: Conjunctivae and EOM are normal.  Neck: Normal range of motion.  Cardiovascular: Normal rate and normal heart sounds.   Pulmonary/Chest: Breath sounds normal.  Abdominal: Bowel sounds are normal.  Surgical scars noted Large right-sided hernia noted  Genitourinary:  2 cm, pedunculated peri- Rectal lesion Internal exam limited due to tight anal tone Stool was hematest positive    Musculoskeletal: Normal range of motion. He exhibits no edema and no tenderness.  Neurological: He is alert and oriented to person, place, and time.  Psychiatric: He has a normal mood and affect. His behavior is normal.          Assessment & Plan:   Rectal discharge Benign rectal lesion Hypertension  Options were discussed.  The patient has no health insurance and he wishes to return to his general surgeons in [place].  He is aware that he will need further evaluation Was asked return in 6 months for an annual exam.

## 2014-02-14 ENCOUNTER — Telehealth: Payer: Self-pay | Admitting: Internal Medicine

## 2014-02-14 NOTE — Telephone Encounter (Signed)
Relevant patient education mailed to patient.  

## 2014-06-04 ENCOUNTER — Other Ambulatory Visit: Payer: Self-pay | Admitting: Internal Medicine

## 2014-11-26 ENCOUNTER — Other Ambulatory Visit: Payer: Self-pay | Admitting: Internal Medicine

## 2015-06-15 ENCOUNTER — Other Ambulatory Visit: Payer: Self-pay | Admitting: Internal Medicine

## 2015-06-18 ENCOUNTER — Other Ambulatory Visit: Payer: Self-pay | Admitting: Internal Medicine

## 2015-06-18 NOTE — Telephone Encounter (Signed)
Pt request refill   sertraline (ZOLOFT) 50 MG tablet lisinopril-hydrochlorothiazide (PRINZIDE,ZESTORETIC) 20-25 MG per tablet atenolol (TENORMIN) 100 MG tablet  harrit teeter/ [address]   Pt made appt for august 25

## 2015-06-19 MED ORDER — ATENOLOL 100 MG PO TABS: ORAL_TABLET | ORAL | Status: DC

## 2015-06-19 MED ORDER — SERTRALINE HCL 50 MG PO TABS: 50.0000 mg | ORAL_TABLET | Freq: Every day | ORAL | Status: DC

## 2015-06-19 MED ORDER — LISINOPRIL-HYDROCHLOROTHIAZIDE 20-25 MG PO TABS: ORAL_TABLET | ORAL | Status: DC

## 2015-06-19 NOTE — Telephone Encounter (Signed)
Refills sent

## 2015-07-04 ENCOUNTER — Encounter: Payer: Self-pay | Admitting: Internal Medicine

## 2015-07-04 ENCOUNTER — Other Ambulatory Visit: Payer: Self-pay | Admitting: *Deleted

## 2015-07-04 ENCOUNTER — Ambulatory Visit (INDEPENDENT_AMBULATORY_CARE_PROVIDER_SITE_OTHER): Payer: Self-pay | Admitting: Internal Medicine

## 2015-07-04 VITALS — BP 100/68 | HR 80 | Temp 99.2°F | Resp 20 | Ht 70.0 in | Wt 186.0 lb

## 2015-07-04 DIAGNOSIS — I10 Essential (primary) hypertension: Secondary | ICD-10-CM

## 2015-07-04 DIAGNOSIS — F17200 Nicotine dependence, unspecified, uncomplicated: Secondary | ICD-10-CM

## 2015-07-04 DIAGNOSIS — Z720 Tobacco use: Secondary | ICD-10-CM

## 2015-07-04 MED ORDER — SERTRALINE HCL 50 MG PO TABS: 50.0000 mg | ORAL_TABLET | Freq: Every day | ORAL | Status: DC

## 2015-07-04 MED ORDER — LISINOPRIL-HYDROCHLOROTHIAZIDE 20-25 MG PO TABS: ORAL_TABLET | ORAL | Status: DC

## 2015-07-04 MED ORDER — ATENOLOL 50 MG PO TABS: ORAL_TABLET | ORAL | Status: DC

## 2015-07-04 NOTE — Progress Notes (Signed)
Subjective:    Patient ID: Ryan Daniel, male    DOB: 14-Apr-1951, 64 y.o.   MRN: [id number]  HPI  BP Readings from Last 3 Encounters:  07/04/15 100/68  02/13/14 130/82  09/21/12 160/90    Wt Readings from Last 3 Encounters:  07/04/15 186 lb (84.369 kg)  02/13/14 208 lb (94.348 kg)  09/21/12 191 lb (86.68 kg)   64 year old patient who is seen today for follow-up of hypertension.  He has a history of ongoing tobacco use.  He has been hospitalized twice per this year due to a small bowel obstruction followed by repair of ventral hernia.  Doing quite well. There is been some significant weight loss since his surgery.  He generally feels well  Past Medical History  Diagnosis Date  . DEPRESSION 02/05/2010  . GERD 02/05/2010  . HYPERTENSION 02/05/2010  . TOBACCO ABUSE 09/15/2010    Social History   Social History  . Marital Status: Single    Spouse Name: N/A  . Number of Children: N/A  . Years of Education: N/A   Occupational History  . Not on file.   Social History Main Topics  . Smoking status: Current Every Day Smoker -- 0.50 packs/day    Types: Cigarettes  . Smokeless tobacco: Never Used  . Alcohol Use: 3.5 oz/week    7 drink(s) per week  . Drug Use: No  . Sexual Activity: Not on file   Other Topics Concern  . Not on file   Social History Narrative    No past surgical history on file.  Family History  Problem Relation Age of Onset  . Cancer Maternal Grandmother     colon  . Heart disease Paternal Grandfather     Allergies  Allergen Reactions  . Prochlorperazine Edisylate     Current Outpatient Prescriptions on File Prior to Visit  Medication Sig Dispense Refill  . lisinopril-hydrochlorothiazide (PRINZIDE,ZESTORETIC) 20-25 MG per tablet TAKE 1 TABLET(S) BY MOUTH DAILY 30 tablet 0  . omeprazole (PRILOSEC) 20 MG capsule Take 1 capsule (20 mg total) by mouth daily. 90 capsule 4  . sertraline (ZOLOFT) 50 MG tablet Take 1 tablet (50 mg total) by mouth  daily. 30 tablet 0   No current facility-administered medications on file prior to visit.    BP 100/68 mmHg  Pulse 80  Temp(Src) 99.2 F (37.3 C) (Oral)  Resp 20  Ht 5\' 10"  (1.778 m)  Wt 186 lb (84.369 kg)  BMI 26.69 kg/m2  SpO2 97%     Review of Systems  Constitutional: Negative for fever, chills, appetite change and fatigue.  HENT: Negative for congestion, dental problem, ear pain, hearing loss, sore throat, tinnitus, trouble swallowing and voice change.   Eyes: Negative for pain, discharge and visual disturbance.  Respiratory: Negative for cough, chest tightness, wheezing and stridor.   Cardiovascular: Negative for chest pain, palpitations and leg swelling.  Gastrointestinal: Negative for nausea, vomiting, abdominal pain, diarrhea, constipation, blood in stool and abdominal distention.  Genitourinary: Negative for urgency, hematuria, flank pain, discharge, difficulty urinating and genital sores.  Musculoskeletal: Negative for myalgias, back pain, joint swelling, arthralgias, gait problem and neck stiffness.  Skin: Negative for rash.  Neurological: Negative for dizziness, syncope, speech difficulty, weakness, numbness and headaches.  Hematological: Negative for adenopathy. Does not bruise/bleed easily.  Psychiatric/Behavioral: Negative for behavioral problems and dysphoric mood. The patient is not nervous/anxious.        Objective:   Physical Exam  Constitutional:  Blood pressure 104/70  Abdominal:  Healing mid abdominal incision          Assessment & Plan:   Hypertension, well-controlled.  We'll decrease atenolol to 50 mg daily Ongoing tobacco use.  Total smoking cessation encouraged GERD.  Consider trial off omeprazole with weight loss  Recheck 6 months

## 2015-07-04 NOTE — Progress Notes (Signed)
Pre visit review using our clinic review tool, if applicable. No additional management support is needed unless otherwise documented below in the visit note. 

## 2015-07-04 NOTE — Patient Instructions (Signed)
Limit your sodium (Salt) intake  Please check your blood pressure on a regular basis.  If it is consistently greater than 150/90, please make an office appointment.  Avoids foods high in acid such as tomatoes citrus juices, and spicy foods.  Avoid eating within two hours of lying down or before exercising.  Do not overheat.  Try smaller more frequent meals.  If symptoms persist, elevate the head of her bed 12 inches while sleeping.  Return in 6 months for follow-up

## 2015-10-12 ENCOUNTER — Other Ambulatory Visit: Payer: Self-pay | Admitting: Internal Medicine

## 2016-01-03 ENCOUNTER — Encounter: Payer: Self-pay | Admitting: Internal Medicine

## 2016-01-03 ENCOUNTER — Ambulatory Visit (INDEPENDENT_AMBULATORY_CARE_PROVIDER_SITE_OTHER): Payer: Self-pay | Admitting: Internal Medicine

## 2016-01-03 VITALS — BP 130/88 | HR 92 | Temp 99.1°F | Resp 20 | Ht 70.0 in | Wt 188.0 lb

## 2016-01-03 DIAGNOSIS — F17200 Nicotine dependence, unspecified, uncomplicated: Secondary | ICD-10-CM

## 2016-01-03 DIAGNOSIS — E876 Hypokalemia: Secondary | ICD-10-CM

## 2016-01-03 DIAGNOSIS — I10 Essential (primary) hypertension: Secondary | ICD-10-CM

## 2016-01-03 DIAGNOSIS — I82401 Acute embolism and thrombosis of unspecified deep veins of right lower extremity: Secondary | ICD-10-CM | POA: Insufficient documentation

## 2016-01-03 LAB — BASIC METABOLIC PANEL
BUN: 19 mg/dL (ref 6–23)
CALCIUM: 9.5 mg/dL (ref 8.4–10.5)
CO2: 29 mEq/L (ref 19–32)
CREATININE: 1.4 mg/dL (ref 0.40–1.50)
Chloride: 102 mEq/L (ref 96–112)
GFR: 54.24 mL/min — AB (ref >60.00)
GLUCOSE: 80 mg/dL (ref 70–99)
Potassium: 4.6 mEq/L (ref 3.5–5.1)
Sodium: 137 mEq/L (ref 135–145)

## 2016-01-03 LAB — CBC WITH DIFFERENTIAL/PLATELET
BASOS PCT: 0.5 % (ref 0.0–3.0)
Basophils Absolute: 0.1 10*3/uL (ref 0.0–0.1)
EOS ABS: 0.2 10*3/uL (ref 0.0–0.7)
EOS PCT: 2.1 % (ref 0.0–5.0)
HEMATOCRIT: 41.5 % (ref 39.0–52.0)
HEMOGLOBIN: 14.2 g/dL (ref 13.0–17.0)
LYMPHS PCT: 15.2 % (ref 12.0–46.0)
Lymphs Abs: 1.6 10*3/uL (ref 0.7–4.0)
MCHC: 34.1 g/dL (ref 30.0–36.0)
MCV: 94.2 fl (ref 78.0–100.0)
MONO ABS: 0.7 10*3/uL (ref 0.1–1.0)
Monocytes Relative: 6.2 % (ref 3.0–12.0)
NEUTROS ABS: 8 10*3/uL — AB (ref 1.4–7.7)
Neutrophils Relative %: 76 % (ref 43.0–77.0)
PLATELETS: 367 10*3/uL (ref 150.0–400.0)
RBC: 4.41 Mil/uL (ref 4.22–5.81)
RDW: 12.8 % (ref 11.5–15.5)
WBC: 10.5 10*3/uL (ref 4.0–10.5)

## 2016-01-03 MED ORDER — AMLODIPINE BESYLATE 10 MG PO TABS: 10.0000 mg | ORAL_TABLET | Freq: Every day | ORAL | Status: DC

## 2016-01-03 MED ORDER — SERTRALINE HCL 50 MG PO TABS: ORAL_TABLET | ORAL | Status: DC

## 2016-01-03 NOTE — Progress Notes (Signed)
Subjective:    Patient ID: Ryan Daniel, male    DOB: [birth year], 65 y.o.   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  Patient presented with small bowel obstruction secondary to adhesions.  He had dehydration with electrolyte abnormalities and was also diagnosed with a right leg DVT.  He was treated with the rehydration and repletion of electrolytes.  Amlodipine has been substituted for ace/diuretic combination.  He generally feels well today  He is completing 3 weeks of the Xarelto at a dose of 15 mg twice daily to be followed by 20 mg once daily.  He states that he has obtained a free supply through the pharmaceutical company  No further nausea, vomiting, and his bowel habits have normalized  No further right calf pain  Hospital records reviewed  Past Medical History  Diagnosis Date  . DEPRESSION 02/05/2010  . GERD 02/05/2010  . HYPERTENSION 02/05/2010  . TOBACCO ABUSE 09/15/2010    Social History   Social History  . Marital Status: Single    Spouse Name: N/A  . Number of Children: N/A  . Years of Education: N/A   Occupational History  . Not on file.   Social History Main Topics  . Smoking status: Current Every Day Smoker -- 0.50 packs/day    Types: Cigarettes  . Smokeless tobacco: Never Used  . Alcohol Use: 3.5 oz/week    7 drink(s) per week  . Drug Use: No  . Sexual Activity: Not on file   Other Topics Concern  . Not on file   Social History Narrative    No past surgical history on file.  Family History  Problem Relation Age of Onset  . Cancer Maternal Grandmother     colon  . Heart  disease Paternal Grandfather     Allergies  Allergen Reactions  . Prochlorperazine Edisylate     Current Outpatient Prescriptions on File Prior to Visit  Medication Sig Dispense Refill  . atenolol (TENORMIN) 50 MG tablet TAKE 1 TABLET(S) BY MOUTH DAILY 90 tablet 4  . Multiple Vitamin (THERA) TABS Take 1 tablet by mouth daily.      No current facility-administered medications on file prior to visit.    BP 130/88 mmHg  Pulse 92  Temp(Src) 99.1 F (37.3 C) (Oral)  Resp 20  Ht 5\' 10"  (1.778 m)  Wt 188 lb (85.276 kg)  BMI 26.98 kg/m2  SpO2 98%     Review of Systems  Constitutional: Negative for fever, chills, appetite change and fatigue.  HENT: Negative for congestion, dental problem, ear pain, hearing loss, sore throat, tinnitus, trouble swallowing and voice change.   Eyes: Negative for pain, discharge and visual disturbance.  Respiratory: Negative for cough, chest tightness, wheezing and stridor.   Cardiovascular: Negative for chest pain, palpitations and leg swelling.  Gastrointestinal: Negative for nausea, vomiting, abdominal pain, diarrhea, constipation, blood in stool and abdominal distention.  Genitourinary: Negative for urgency, hematuria, flank pain, discharge, difficulty urinating and genital sores.  Musculoskeletal: Negative for myalgias, back pain, joint swelling, arthralgias, gait problem and neck stiffness.  Skin: Negative for rash.  Neurological: Negative for dizziness, syncope, speech difficulty, weakness, numbness and headaches.  Hematological:  Negative for adenopathy. Does not bruise/bleed easily.  Psychiatric/Behavioral: Negative for behavioral problems and dysphoric mood. The patient is not nervous/anxious.        Objective:   Physical Exam  Constitutional: He is oriented to person, place, and time. He appears well-developed.  Blood pressure 130/80  HENT:  Head: Normocephalic.  Right Ear: External ear normal.  Left Ear: External ear normal.  Appears  pink and well hydrated  Eyes: Conjunctivae and EOM are normal.  Neck: Normal range of motion.  Cardiovascular: Normal rate and normal heart sounds.   Pulmonary/Chest: Breath sounds normal.  Abdominal: Bowel sounds are normal.  Musculoskeletal: Normal range of motion. He exhibits no edema or tenderness.  Tenderness Trace edema, right ankle  Neurological: He is alert and oriented to person, place, and time.  Psychiatric: He has a normal mood and affect. His behavior is normal.          Assessment & Plan:   Status post hospital admission for dehydration with renal insufficiency and electrolyte imbalance.  Will check a CBC and electrolytes and renal function studies.  Will complete present supply of potassium and magnesium supplements and then discontinue.  Will manage his hypertension off diuretic therapy Hypertension, stable Right leg DVT.  Will continue anticoagulation for a minimum of 3 months.  We'll reassess at that time

## 2016-01-03 NOTE — Progress Notes (Signed)
Pre visit review using our clinic review tool, if applicable. No additional management support is needed unless otherwise documented below in the visit note. 

## 2016-01-03 NOTE — Patient Instructions (Signed)
Please check your blood pressure on a regular basis.  If it is consistently greater than 150/90, please make an office appointment.  Return in 3 months for follow-up  Deep Vein Thrombosis A deep vein thrombosis (DVT) is a blood clot (thrombus) that usually occurs in a deep, larger vein of the lower leg or the pelvis, or in an upper extremity such as the arm. These are dangerous and can lead to serious and even life-threatening complications if the clot travels to the lungs. A DVT can damage the valves in your leg veins so that instead of flowing upward, the blood pools in the lower leg. This is called post-thrombotic syndrome, and it can result in pain, swelling, discoloration, and sores on the leg. CAUSES A DVT is caused by the formation of a blood clot in your leg, pelvis, or arm. Usually, several things contribute to the formation of blood clots. A clot may develop when:  Your blood flow slows down.  Your vein becomes damaged in some way.  You have a condition that makes your blood clot more easily. RISK FACTORS A DVT is more likely to develop in:  People who are older, especially over 51 years of age.  People who are overweight (obese).  People who sit or lie still for a long time, such as during long-distance travel (over 4 hours), bed rest, hospitalization, or during recovery from certain medical conditions like a stroke.  People who do not engage in much physical activity (sedentary lifestyle).  People who have chronic breathing disorders.  People who have a personal or family history of blood clots or blood clotting disease.  People who have peripheral vascular disease (PVD), diabetes, or some types of cancer.  People who have heart disease, especially if the person had a recent heart attack or has congestive heart failure.  People who have neurological diseases that affect the legs (leg paresis).  People who have had a traumatic injury, such as breaking a hip or  leg.  People who have recently had major or lengthy surgery, especially on the hip, knee, or abdomen.  People who have had a central line placed inside a large vein.  People who take medicines that contain the hormone estrogen. These include birth control pills and hormone replacement therapy.  Pregnancy or during childbirth or the postpartum period.  Long plane flights (over 8 hours). SIGNS AND SYMPTOMS Symptoms of a DVT can include:   Swelling of your leg or arm, especially if one side is much worse.  Warmth and redness of your leg or arm, especially if one side is much worse.  Pain in your arm or leg. If the clot is in your leg, symptoms may be more noticeable or worse when you stand or walk.  A feeling of pins and needles, if the clot is in the arm. The symptoms of a DVT that has traveled to the lungs (pulmonary embolism, PE) usually start suddenly and include:  Shortness of breath while active or at rest.  Coughing or coughing up blood or blood-tinged mucus.  Chest pain that is often worse with deep breaths.  Rapid or irregular heartbeat.  Feeling light-headed or dizzy.  Fainting.  Feeling anxious.  Sweating. There may also be pain and swelling in a leg if that is where the blood clot started. These symptoms may represent a serious problem that is an emergency. Do not wait to see if the symptoms will go away. Get medical help right away. Call your local emergency  services (911 in the [place] Do not drive yourself to the hospital. DIAGNOSIS Your health care provider will take a medical history and perform a physical exam. You may also have other tests, including:  Blood tests to assess the clotting properties of your blood.  Imaging tests, such as CT, ultrasound, MRI, X-ray, and other tests to see if you have clots anywhere in your body. TREATMENT After a DVT is identified, it can be treated. The type of treatment that you receive depends on many factors, such as the  cause of your DVT, your risk for bleeding or developing more clots, and other medical conditions that you have. Sometimes, a combination of treatments is necessary. Treatment options may be combined and include:  Monitoring the blood clot with ultrasound.  Taking medicines by mouth, such as newer blood thinners (anticoagulants), thrombolytics, or warfarin.  Taking anticoagulant medicine by injection or through an IV tube.  Wearing compression stockings or using different types ofdevices.  Surgery (rare) to remove the blood clot or to place a filter in your abdomen to stop the blood clot from traveling to your lungs. Treatments for a DVT are often divided into immediate treatment and long-term treatment (up to 3 months after DVT). You can work with your health care provider to choose the treatment program that is best for you. HOME CARE INSTRUCTIONS If you are taking a newer oral anticoagulant:  Take the medicine every single day at the same time each day.  Understand what foods and drugs interact with this medicine.  Understand that there are no regular blood tests required when using this medicine.  Understand the side effects of this medicine, including excessive bruising or bleeding. Ask your health care provider or pharmacist about other possible side effects. If you are taking warfarin:  Understand how to take warfarin and know which foods can affect how warfarin works in Veterinary surgeon.  Understand that it is dangerous to take too much or too little warfarin. Too much warfarin increases the risk of bleeding. Too little warfarin continues to allow the risk for blood clots.  Follow your PT and INR blood testing schedule. The PT and INR results allow your health care provider to adjust your dose of warfarin. It is very important that you have your PT and INR tested as often as told by your health care provider.  Avoid major changes in your diet, or tell your health care provider before  you change your diet. Arrange a visit with a registered dietitian to answer your questions. Many foods, especially foods that are high in vitamin K, can interfere with warfarin and affect the PT and INR results. Eat a consistent amount of foods that are high in vitamin K, such as:  Spinach, kale, broccoli, cabbage, collard greens, turnip greens, Brussels sprouts, peas, cauliflower, seaweed, and parsley.  Beef liver and pork liver.  Green tea.  Soybean oil.  Tell your health care provider about any and all medicines, vitamins, and supplements that you take, including aspirin and other over-the-counter anti-inflammatory medicines. Be especially cautious with aspirin and anti-inflammatory medicines. Do not take those before you ask your health care provider if it is safe to do so. This is important because many medicines can interfere with warfarin and affect the PT and INR results.  Do not start or stop taking any over-the-counter or prescription medicine unless your health care provider or pharmacist tells you to do so. If you take warfarin, you will also need to do these things:  Hold pressure over cuts for longer than usual.  Tell your dentist and other health care providers that you are taking warfarin before you have any procedures in which bleeding may occur.  Avoid alcohol or drink very small amounts. Tell your health care provider if you change your alcohol intake.  Do not use tobacco products, including cigarettes, chewing tobacco, and e-cigarettes. If you need help quitting, ask your health care provider.  Avoid contact sports. General Instructions  Take over-the-counter and prescription medicines only as told by your health care provider. Anticoagulant medicines can have side effects, including easy bruising and difficulty stopping bleeding. If you are prescribed an anticoagulant, you will also need to do these things:  Hold pressure over cuts for longer than usual.  Tell your  dentist and other health care providers that you are taking anticoagulants before you have any procedures in which bleeding may occur.  Avoid contact sports.  Wear a medical alert bracelet or carry a medical alert card that says you have had a PE.  Ask your health care provider how soon you can go back to your normal activities. Stay active to prevent new blood clots from forming.  Make sure to exercise while traveling or when you have been sitting or standing for a long period of time. It is very important to exercise. Exercise your legs by walking or by tightening and relaxing your leg muscles often. Take frequent walks.  Wear compression stockings as told by your health care provider to help prevent more blood clots from forming.  Do not use tobacco products, including cigarettes, chewing tobacco, and e-cigarettes. If you need help quitting, ask your health care provider.  Keep all follow-up appointments with your health care provider. This is important. PREVENTION Take these actions to decrease your risk of developing another DVT:  Exercise regularly. For at least 30 minutes every day, engage in:  Activity that involves moving your arms and legs.  Activity that encourages good blood flow through your body by increasing your heart rate.  Exercise your arms and legs every hour during long-distance travel (over 4 hours). Drink plenty of water and avoid drinking alcohol while traveling.  Avoid sitting or lying in bed for long periods of time without moving your legs.  Maintain a weight that is appropriate for your height. Ask your health care provider what weight is healthy for you.  If you are a woman who is over 65 years of age, avoid unnecessary use of medicines that contain estrogen. These include birth control pills.  Do not smoke, especially if you take estrogen medicines. If you need help quitting, ask your health care provider. If you are hospitalized, prevention measures may  include:  Early walking after surgery, as soon as your health care provider says that it is safe.  Receiving anticoagulants to prevent blood clots.If you cannot take anticoagulants, other options may be available, such as wearing compression stockings or using different types of devices. SEEK IMMEDIATE MEDICAL CARE IF:  You have new or increased pain, swelling, or redness in an arm or leg.  You have numbness or tingling in an arm or leg.  You have shortness of breath while active or at rest.  You have chest pain.  You have a rapid or irregular heartbeat.  You feel light-headed or dizzy.  You cough up blood.  You notice blood in your vomit, bowel movement, or urine. These symptoms may represent a serious problem that is an emergency. Do not wait to  see if the symptoms will go away. Get medical help right away. Call your local emergency services (911 in the [place] Do not drive yourself to the hospital.   This information is not intended to replace advice given to you by your health care provider. Make sure you discuss any questions you have with your health care provider.   Document Released: 10/26/2005 Document Revised: 07/17/2015 Document Reviewed: 02/20/2015 Elsevier Interactive Patient Education [employer].

## 2016-01-07 ENCOUNTER — Telehealth: Payer: Self-pay | Admitting: Internal Medicine

## 2016-01-07 NOTE — Telephone Encounter (Signed)
Pt would like a call back about his lab results   °

## 2016-01-07 NOTE — Telephone Encounter (Signed)
Pt calling for lab results / please advise  °

## 2016-01-08 NOTE — Telephone Encounter (Signed)
Please call/notify patient that lab/test/procedure is normal 

## 2016-01-09 NOTE — Telephone Encounter (Signed)
Pt called back, told him lab results were normal per Dr.K. Pt verbalized understanding.

## 2016-01-09 NOTE — Telephone Encounter (Signed)
Left message on voicemail to call office.  

## 2016-01-17 ENCOUNTER — Telehealth: Payer: Self-pay | Admitting: Internal Medicine

## 2016-01-17 MED ORDER — RIVAROXABAN 20 MG PO TABS: 20.0000 mg | ORAL_TABLET | Freq: Every day | ORAL | Status: DC

## 2016-01-17 NOTE — Telephone Encounter (Signed)
Pt needs xarelto 15 mg w. Refills  today. Pt is out.[facility]

## 2016-01-17 NOTE — Telephone Encounter (Signed)
Spoke to pt, told him I have he is suppose to be taking 15 mg twice a day and then decrease to 20 mg daily. So which are you on? Pt said he has two pills left of 15 mg and then he will start 20 mg. So you need 20 mg tablets. Pt said yes. Told pt okay will send Rx for Xarelto 20 mg one tablet daily to pharmacy. Pt verbalized understanding. Rx sent to pharmacy.

## 2016-03-13 ENCOUNTER — Other Ambulatory Visit: Payer: Self-pay | Admitting: Internal Medicine

## 2016-04-26 ENCOUNTER — Other Ambulatory Visit: Payer: Self-pay | Admitting: Internal Medicine

## 2016-08-12 ENCOUNTER — Telehealth: Payer: Self-pay | Admitting: Internal Medicine

## 2016-08-12 NOTE — Telephone Encounter (Signed)
Pt need new Rx for atenolol #90  Pharm:  [facility] in [place]

## 2016-08-13 MED ORDER — ATENOLOL 50 MG PO TABS: ORAL_TABLET | ORAL | 3 refills | Status: DC

## 2016-08-13 NOTE — Telephone Encounter (Signed)
Left message on voicemail Rx sent to pharmacy as requested. 

## 2017-01-26 ENCOUNTER — Other Ambulatory Visit: Payer: Self-pay | Admitting: Internal Medicine

## 2017-01-28 ENCOUNTER — Other Ambulatory Visit: Payer: Self-pay | Admitting: Internal Medicine

## 2017-01-28 MED ORDER — LISINOPRIL-HYDROCHLOROTHIAZIDE 20-25 MG PO TABS: ORAL_TABLET | ORAL | 2 refills | Status: DC

## 2017-01-28 MED ORDER — SERTRALINE HCL 50 MG PO TABS: ORAL_TABLET | ORAL | 2 refills | Status: DC

## 2017-02-01 ENCOUNTER — Other Ambulatory Visit: Payer: Self-pay | Admitting: Internal Medicine

## 2017-02-01 MED ORDER — SERTRALINE HCL 50 MG PO TABS: ORAL_TABLET | ORAL | 0 refills | Status: DC

## 2017-02-01 MED ORDER — LISINOPRIL-HYDROCHLOROTHIAZIDE 20-25 MG PO TABS: 1.0000 | ORAL_TABLET | Freq: Every day | ORAL | 0 refills | Status: DC

## 2017-02-01 MED ORDER — ATENOLOL 50 MG PO TABS: 50.0000 mg | ORAL_TABLET | Freq: Every day | ORAL | 0 refills | Status: DC

## 2017-02-11 ENCOUNTER — Telehealth: Payer: Self-pay | Admitting: Internal Medicine

## 2017-02-11 NOTE — Telephone Encounter (Signed)
Received a physician new RX form from [place] for medications : Sertraline, Atenolol, and Lisinopril-hydrochlorothiazide. Pt has not had an OV since 01/03/16 last time medications were refill I stated "needs office visit for further refills" a message was left on pt's voicemail to return call to office.

## 2017-02-23 ENCOUNTER — Ambulatory Visit (INDEPENDENT_AMBULATORY_CARE_PROVIDER_SITE_OTHER): Payer: Medicare HMO | Admitting: Internal Medicine

## 2017-02-23 ENCOUNTER — Encounter: Payer: Self-pay | Admitting: Internal Medicine

## 2017-02-23 VITALS — BP 112/88 | HR 70 | Temp 98.2°F | Wt 202.8 lb

## 2017-02-23 DIAGNOSIS — I82411 Acute embolism and thrombosis of right femoral vein: Secondary | ICD-10-CM

## 2017-02-23 DIAGNOSIS — E538 Deficiency of other specified B group vitamins: Secondary | ICD-10-CM

## 2017-02-23 DIAGNOSIS — Z23 Encounter for immunization: Secondary | ICD-10-CM | POA: Diagnosis not present

## 2017-02-23 DIAGNOSIS — Z0000 Encounter for general adult medical examination without abnormal findings: Secondary | ICD-10-CM | POA: Diagnosis not present

## 2017-02-23 DIAGNOSIS — K219 Gastro-esophageal reflux disease without esophagitis: Secondary | ICD-10-CM | POA: Diagnosis not present

## 2017-02-23 DIAGNOSIS — I10 Essential (primary) hypertension: Secondary | ICD-10-CM

## 2017-02-23 LAB — COMPREHENSIVE METABOLIC PANEL
ALBUMIN: 4.4 g/dL (ref 3.5–5.2)
ALK PHOS: 83 U/L (ref 39–117)
ALT: 20 U/L (ref 0–53)
AST: 20 U/L (ref 0–37)
BUN: 38 mg/dL — ABNORMAL HIGH (ref 6–23)
CO2: 25 mEq/L (ref 19–32)
Calcium: 9.7 mg/dL (ref 8.4–10.5)
Chloride: 101 mEq/L (ref 96–112)
Creatinine, Ser: 1.82 mg/dL — ABNORMAL HIGH (ref 0.40–1.50)
GFR: 39.92 mL/min — AB (ref >60.00)
Glucose, Bld: 73 mg/dL (ref 70–99)
POTASSIUM: 5 meq/L (ref 3.5–5.1)
Sodium: 135 mEq/L (ref 135–145)
TOTAL PROTEIN: 8.1 g/dL (ref 6.0–8.3)
Total Bilirubin: 0.4 mg/dL (ref 0.2–1.2)

## 2017-02-23 LAB — CBC WITH DIFFERENTIAL/PLATELET
BASOS ABS: 0.1 10*3/uL (ref 0.0–0.1)
Basophils Relative: 0.7 % (ref 0.0–3.0)
EOS PCT: 1.9 % (ref 0.0–5.0)
Eosinophils Absolute: 0.2 10*3/uL (ref 0.0–0.7)
HCT: 47.5 % (ref 39.0–52.0)
HEMOGLOBIN: 16.2 g/dL (ref 13.0–17.0)
Lymphocytes Relative: 18.7 % (ref 12.0–46.0)
Lymphs Abs: 1.6 10*3/uL (ref 0.7–4.0)
MCHC: 34.1 g/dL (ref 30.0–36.0)
MCV: 95.3 fl (ref 78.0–100.0)
MONO ABS: 0.7 10*3/uL (ref 0.1–1.0)
Monocytes Relative: 8.4 % (ref 3.0–12.0)
Neutro Abs: 6.1 10*3/uL (ref 1.4–7.7)
Neutrophils Relative %: 70.3 % (ref 43.0–77.0)
Platelets: 280 10*3/uL (ref 150.0–400.0)
RBC: 4.98 Mil/uL (ref 4.22–5.81)
RDW: 13.3 % (ref 11.5–15.5)
WBC: 8.7 10*3/uL (ref 4.0–10.5)

## 2017-02-23 LAB — LDL CHOLESTEROL, DIRECT: LDL DIRECT: 110 mg/dL

## 2017-02-23 LAB — LIPID PANEL
CHOLESTEROL: 247 mg/dL — AB (ref 0–200)
HDL: 35.7 mg/dL — ABNORMAL LOW (ref >39.00)
Total CHOL/HDL Ratio: 7
Triglycerides: 666 mg/dL — ABNORMAL HIGH (ref 0.0–149.0)

## 2017-02-23 LAB — TSH: TSH: 1.12 u[IU]/mL (ref 0.35–4.50)

## 2017-02-23 LAB — VITAMIN B12: VITAMIN B 12: 719 pg/mL (ref 211–911)

## 2017-02-23 MED ORDER — VITAMIN B-12 1000 MCG PO TABS: 1000.0000 ug | ORAL_TABLET | Freq: Every day | ORAL | Status: DC

## 2017-02-23 NOTE — Progress Notes (Signed)
Subjective:    Patient ID: Ryan Daniel, male    DOB: 08-May-1951, 66 y.o.   MRN: [id number]  HPI  66 year old patient who is seen today for a wellness exam. He was hospitalized in February 2017 for partial small bowel obstruction secondary to adhesions  He had a colonoscopy performed at that time He had a remote cholecystectomy in approximately 6295 which was complicated by colonic perforation which required partial colectomy and diverting colostomy.  He also required repair of multiple abdominal hernias. He has essential hypertension and also a history of B12 deficiency Hospital course was, could by right leg DVT and he was treated with 3 months of anticoagulation Do well today. Discontinued tobacco in December 2017  Social history retired, but looking to start part-time employment Single  Past Medical History:  Diagnosis Date  . DEPRESSION 02/05/2010  . GERD 02/05/2010  . HYPERTENSION 02/05/2010  . TOBACCO ABUSE 09/15/2010     Social History   Social History  . Marital status: Single    Spouse name: N/A  . Number of children: N/A  . Years of education: N/A   Occupational History  . Not on file.   Social History Main Topics  . Smoking status: Current Every Day Smoker    Packs/day: 0.50    Types: Cigarettes  . Smokeless tobacco: Never Used  . Alcohol use 3.5 oz/week    7 drink(s) per week  . Drug use: No  . Sexual activity: Not on file   Other Topics Concern  . Not on file   Social History Narrative  . No narrative on file    No past surgical history on file.  Family History  Problem Relation Age of Onset  . Cancer Maternal Grandmother     colon  . Heart disease Paternal Grandfather     Allergies  Allergen Reactions  . Amlodipine   . Prochlorperazine Edisylate     Current Outpatient Prescriptions on File Prior to Visit  Medication Sig Dispense Refill  . atenolol (TENORMIN) 50 MG tablet Take 1 tablet (50 mg total) by mouth daily. 30 tablet 0  .  lisinopril-hydrochlorothiazide (PRINZIDE,ZESTORETIC) 20-25 MG tablet Take 1 tablet by mouth daily. TAKE 1 TABLET(S) BY MOUTH DAILY 30 tablet 0  . magnesium 30 MG tablet Take 30 mg by mouth daily.    . Multiple Vitamin (THERA) TABS Take 1 tablet by mouth daily.     Marland Kitchen omeprazole (PRILOSEC) 40 MG capsule Take 40 mg by mouth daily.    . Potassium 75 MG TABS Take 1 tablet by mouth daily.    . rivaroxaban (XARELTO) 20 MG TABS tablet Take 1 tablet (20 mg total) by mouth daily with supper. 30 tablet 5  . sertraline (ZOLOFT) 50 MG tablet TAKE 1 TABLET (50 MG TOTAL) BY MOUTH DAILY. 30 tablet 0  . amLODipine (NORVASC) 10 MG tablet Take 1 tablet (10 mg total) by mouth daily. 90 tablet 1   No current facility-administered medications on file prior to visit.     BP 112/88 (BP Location: Left Arm, Patient Position: Sitting, Cuff Size: Normal)   Pulse 70   Temp 98.2 F (36.8 C) (Oral)   Wt 202 lb 12.8 oz (92 kg)   SpO2 98%   BMI 29.10 kg/m    Review of Systems  Constitutional: Negative for appetite change, chills, fatigue and fever.  HENT: Negative for congestion, dental problem, ear pain, hearing loss, sore throat, tinnitus, trouble swallowing and voice change.   Eyes: Negative for  pain, discharge and visual disturbance.  Respiratory: Negative for cough, chest tightness, wheezing and stridor.   Cardiovascular: Negative for chest pain, palpitations and leg swelling.  Gastrointestinal: Negative for abdominal distention, abdominal pain, blood in stool, constipation, diarrhea, nausea and vomiting.  Genitourinary: Negative for difficulty urinating, discharge, flank pain, genital sores, hematuria and urgency.  Musculoskeletal: Negative for arthralgias, back pain, gait problem, joint swelling, myalgias and neck stiffness.  Skin: Negative for rash.  Neurological: Negative for dizziness, syncope, speech difficulty, weakness, numbness and headaches.  Hematological: Negative for adenopathy. Does not  bruise/bleed easily.  Psychiatric/Behavioral: Negative for behavioral problems and dysphoric mood. The patient is not nervous/anxious.        Objective:   Physical Exam  Constitutional: He appears well-developed and well-nourished.  HENT:  Head: Normocephalic and atraumatic.  Right Ear: External ear normal.  Left Ear: External ear normal.  Nose: Nose normal.  Mouth/Throat: Oropharynx is clear and moist.  Eyes: Conjunctivae and EOM are normal. Pupils are equal, round, and reactive to light. No scleral icterus.  Neck: Normal range of motion. Neck supple. No JVD present. No thyromegaly present.  Cardiovascular: Regular rhythm, normal heart sounds and intact distal pulses.  Exam reveals no gallop and no friction rub.   No murmur heard. Pulmonary/Chest: Effort normal and breath sounds normal. He exhibits no tenderness.  Abdominal: Soft. Bowel sounds are normal. He exhibits no distension and no mass. There is no tenderness.  Genitourinary: Penis normal.  Genitourinary Comments: External hemorrhoid Stool heme-negative Prostate exam limited due to patient discomfort  Musculoskeletal: Normal range of motion. He exhibits no edema or tenderness.  Lymphadenopathy:    He has no cervical adenopathy.  Neurological: He is alert. He has normal reflexes. No cranial nerve deficit. Coordination normal.  Skin: Skin is warm and dry. No rash noted.  Psychiatric: He has a normal mood and affect. His behavior is normal.          Assessment & Plan:   Preventive health care B12 deficiency.  We'll check B12 level.  Continue oral supplements indefinitely Essential hypertension, well-controlled History of gastroesophageal reflux disease. History right leg DVT.  Provoked  We'll check updated lab Continue home blood pressure monitoring Recheck one year  Nyoka Cowden

## 2017-02-23 NOTE — Patient Instructions (Addendum)
WE NOW OFFER   [facility] FAST TRACK!!!  SAME DAY Appointments for ACUTE CARE  Such as: Sprains, Injuries, cuts, abrasions, rashes, muscle pain, joint pain, back pain Colds, flu, sore throats, headache, allergies, cough, fever  Ear pain, sinus and eye infections Abdominal pain, nausea, vomiting, diarrhea, upset stomach Animal/insect bites  3 Easy Ways to Schedule: Walk-In Scheduling Call in scheduling Mychart Sign-up: [email or web address]   Limit your sodium (Salt) intake  Please check your blood pressure on a regular basis.  If it is consistently greater than 150/90, please make an office appointment.    It is important that you exercise regularly, at least 20 minutes 3 to 4 times per week.  If you develop chest pain or shortness of breath seek  medical attention.  Please check your blood pressure on a regular basis.  If it is consistently greater than 150/90, please make an office appointment.  Return in one year for follow-up

## 2017-02-23 NOTE — Progress Notes (Signed)
Pre visit review using our clinic review tool, if applicable. No additional management support is needed unless otherwise documented below in the visit note. 

## 2017-02-24 ENCOUNTER — Other Ambulatory Visit: Payer: Self-pay | Admitting: Internal Medicine

## 2017-02-24 LAB — HEPATITIS C ANTIBODY: HCV AB: NEGATIVE

## 2017-02-25 ENCOUNTER — Telehealth: Payer: Self-pay

## 2017-02-25 ENCOUNTER — Encounter: Payer: Self-pay | Admitting: Internal Medicine

## 2017-02-25 MED ORDER — ATENOLOL 50 MG PO TABS: 50.0000 mg | ORAL_TABLET | Freq: Every day | ORAL | 1 refills | Status: DC

## 2017-02-25 NOTE — Telephone Encounter (Signed)
Patient had an appointment on 02/23/2017. Atenolol refill was provided on 02/25/2017.

## 2017-04-19 ENCOUNTER — Other Ambulatory Visit: Payer: Self-pay | Admitting: Family Medicine

## 2017-04-19 MED ORDER — ATENOLOL 50 MG PO TABS: 50.0000 mg | ORAL_TABLET | Freq: Every day | ORAL | 0 refills | Status: DC

## 2017-06-23 ENCOUNTER — Other Ambulatory Visit: Payer: Self-pay | Admitting: Internal Medicine

## 2017-06-23 MED ORDER — ATENOLOL 50 MG PO TABS: 50.0000 mg | ORAL_TABLET | Freq: Every day | ORAL | 3 refills | Status: DC

## 2017-07-29 ENCOUNTER — Encounter: Payer: Self-pay | Admitting: Internal Medicine

## 2017-08-05 ENCOUNTER — Telehealth: Payer: Self-pay | Admitting: Internal Medicine

## 2017-08-05 MED ORDER — ATENOLOL 50 MG PO TABS: 50.0000 mg | ORAL_TABLET | Freq: Every day | ORAL | 3 refills | Status: DC

## 2017-08-05 NOTE — Telephone Encounter (Signed)
Medication was refilled.

## 2017-08-05 NOTE — Telephone Encounter (Signed)
Pt needs new rx atenolol 50 mg #90 w/refills sent to [facility]

## 2017-08-16 ENCOUNTER — Other Ambulatory Visit: Payer: Self-pay | Admitting: Internal Medicine

## 2017-11-12 DIAGNOSIS — R1084 Generalized abdominal pain: Secondary | ICD-10-CM | POA: Diagnosis not present

## 2017-11-12 DIAGNOSIS — K56609 Unspecified intestinal obstruction, unspecified as to partial versus complete obstruction: Secondary | ICD-10-CM | POA: Diagnosis not present

## 2017-11-12 DIAGNOSIS — Z9049 Acquired absence of other specified parts of digestive tract: Secondary | ICD-10-CM | POA: Diagnosis not present

## 2017-11-12 DIAGNOSIS — K219 Gastro-esophageal reflux disease without esophagitis: Secondary | ICD-10-CM | POA: Diagnosis not present

## 2017-11-12 DIAGNOSIS — D125 Benign neoplasm of sigmoid colon: Secondary | ICD-10-CM | POA: Diagnosis not present

## 2017-11-12 DIAGNOSIS — E876 Hypokalemia: Secondary | ICD-10-CM | POA: Diagnosis not present

## 2017-11-12 DIAGNOSIS — E860 Dehydration: Secondary | ICD-10-CM | POA: Diagnosis not present

## 2017-11-12 DIAGNOSIS — N400 Enlarged prostate without lower urinary tract symptoms: Secondary | ICD-10-CM | POA: Diagnosis not present

## 2017-11-12 DIAGNOSIS — K760 Fatty (change of) liver, not elsewhere classified: Secondary | ICD-10-CM | POA: Diagnosis not present

## 2017-11-12 DIAGNOSIS — I10 Essential (primary) hypertension: Secondary | ICD-10-CM | POA: Diagnosis not present

## 2017-11-12 DIAGNOSIS — N179 Acute kidney failure, unspecified: Secondary | ICD-10-CM | POA: Diagnosis not present

## 2017-11-12 DIAGNOSIS — E869 Volume depletion, unspecified: Secondary | ICD-10-CM | POA: Diagnosis not present

## 2017-11-12 DIAGNOSIS — D126 Benign neoplasm of colon, unspecified: Secondary | ICD-10-CM | POA: Diagnosis not present

## 2017-11-12 DIAGNOSIS — F329 Major depressive disorder, single episode, unspecified: Secondary | ICD-10-CM | POA: Diagnosis not present

## 2017-11-12 DIAGNOSIS — R740 Nonspecific elevation of levels of transaminase and lactic acid dehydrogenase [LDH]: Secondary | ICD-10-CM | POA: Diagnosis not present

## 2017-11-12 DIAGNOSIS — R109 Unspecified abdominal pain: Secondary | ICD-10-CM | POA: Diagnosis not present

## 2017-11-12 DIAGNOSIS — R05 Cough: Secondary | ICD-10-CM | POA: Diagnosis not present

## 2017-11-12 DIAGNOSIS — Z87891 Personal history of nicotine dependence: Secondary | ICD-10-CM | POA: Diagnosis not present

## 2017-11-12 DIAGNOSIS — N19 Unspecified kidney failure: Secondary | ICD-10-CM | POA: Diagnosis not present

## 2017-11-12 DIAGNOSIS — R5383 Other fatigue: Secondary | ICD-10-CM | POA: Diagnosis not present

## 2017-11-12 DIAGNOSIS — R7989 Other specified abnormal findings of blood chemistry: Secondary | ICD-10-CM | POA: Diagnosis not present

## 2017-11-12 DIAGNOSIS — R140 Abdominal distension (gaseous): Secondary | ICD-10-CM | POA: Diagnosis not present

## 2017-11-12 DIAGNOSIS — E872 Acidosis: Secondary | ICD-10-CM | POA: Diagnosis not present

## 2017-11-12 DIAGNOSIS — K6389 Other specified diseases of intestine: Secondary | ICD-10-CM | POA: Diagnosis not present

## 2017-11-12 DIAGNOSIS — K567 Ileus, unspecified: Secondary | ICD-10-CM | POA: Diagnosis not present

## 2017-11-12 DIAGNOSIS — K635 Polyp of colon: Secondary | ICD-10-CM | POA: Diagnosis not present

## 2017-11-12 DIAGNOSIS — K598 Other specified functional intestinal disorders: Secondary | ICD-10-CM | POA: Diagnosis not present

## 2017-11-12 DIAGNOSIS — R918 Other nonspecific abnormal finding of lung field: Secondary | ICD-10-CM | POA: Diagnosis not present

## 2017-11-12 DIAGNOSIS — R197 Diarrhea, unspecified: Secondary | ICD-10-CM | POA: Diagnosis not present

## 2017-11-12 DIAGNOSIS — Z4682 Encounter for fitting and adjustment of non-vascular catheter: Secondary | ICD-10-CM | POA: Diagnosis not present

## 2017-11-12 DIAGNOSIS — I723 Aneurysm of iliac artery: Secondary | ICD-10-CM | POA: Diagnosis not present

## 2017-11-12 DIAGNOSIS — Z888 Allergy status to other drugs, medicaments and biological substances status: Secondary | ICD-10-CM | POA: Diagnosis not present

## 2017-11-17 DIAGNOSIS — D125 Benign neoplasm of sigmoid colon: Secondary | ICD-10-CM | POA: Diagnosis not present

## 2017-12-04 MED ORDER — SERTRALINE HCL 50 MG PO TABS
50.00 | ORAL_TABLET | ORAL | Status: DC
Start: 2017-12-03 — End: 2017-12-04

## 2017-12-04 MED ORDER — MELATONIN 3 MG PO TABS
3.00 | ORAL_TABLET | ORAL | Status: DC
Start: ? — End: 2017-12-04

## 2017-12-04 MED ORDER — SODIUM CHLORIDE 0.9 % IV SOLN
INTRAVENOUS | Status: DC
Start: ? — End: 2017-12-04

## 2017-12-04 MED ORDER — SIMETHICONE 80 MG PO CHEW
80.00 | CHEWABLE_TABLET | ORAL | Status: DC
Start: ? — End: 2017-12-04

## 2017-12-04 MED ORDER — CLONIDINE HCL 0.1 MG PO TABS
0.10 | ORAL_TABLET | ORAL | Status: DC
Start: ? — End: 2017-12-04

## 2017-12-04 MED ORDER — TRAMADOL HCL 50 MG PO TABS
50.00 | ORAL_TABLET | ORAL | Status: DC
Start: ? — End: 2017-12-04

## 2017-12-04 MED ORDER — ACETAMINOPHEN 325 MG PO TABS
650.00 | ORAL_TABLET | ORAL | Status: DC
Start: ? — End: 2017-12-04

## 2017-12-04 MED ORDER — ONDANSETRON HCL 4 MG/2ML IJ SOLN
4.00 | INTRAMUSCULAR | Status: DC
Start: ? — End: 2017-12-04

## 2017-12-04 MED ORDER — GENERIC EXTERNAL MEDICATION
Status: DC
Start: ? — End: 2017-12-04

## 2017-12-04 MED ORDER — ATENOLOL 25 MG PO TABS
75.00 | ORAL_TABLET | ORAL | Status: DC
Start: 2017-12-03 — End: 2017-12-04

## 2017-12-04 MED ORDER — MAGNESIUM OXIDE 400 MG PO TABS
400.00 | ORAL_TABLET | ORAL | Status: DC
Start: 2017-12-02 — End: 2017-12-04

## 2017-12-04 MED ORDER — METOCLOPRAMIDE HCL 5 MG/ML IJ SOLN
10.00 | INTRAMUSCULAR | Status: DC
Start: ? — End: 2017-12-04

## 2017-12-04 MED ORDER — HEPARIN SODIUM (PORCINE) 5000 UNIT/ML IJ SOLN
INTRAMUSCULAR | Status: DC
Start: 2017-12-02 — End: 2017-12-04

## 2017-12-04 MED ORDER — POLYETHYLENE GLYCOL 3350 17 G PO PACK
17.00 | PACK | ORAL | Status: DC
Start: 2017-12-03 — End: 2017-12-04

## 2017-12-04 MED ORDER — FAT EMULSION 20 % IV EMUL
500.00 | INTRAVENOUS | Status: DC
Start: 2017-12-03 — End: 2017-12-04

## 2017-12-04 MED ORDER — PANTOPRAZOLE SODIUM 20 MG PO TBEC
20.00 | DELAYED_RELEASE_TABLET | ORAL | Status: DC
Start: 2017-12-03 — End: 2017-12-04

## 2017-12-20 ENCOUNTER — Ambulatory Visit (INDEPENDENT_AMBULATORY_CARE_PROVIDER_SITE_OTHER): Payer: Medicare HMO | Admitting: Internal Medicine

## 2017-12-20 ENCOUNTER — Encounter: Payer: Self-pay | Admitting: Internal Medicine

## 2017-12-20 VITALS — BP 122/70 | HR 73 | Temp 98.8°F | Ht 70.0 in | Wt 193.6 lb

## 2017-12-20 DIAGNOSIS — N179 Acute kidney failure, unspecified: Secondary | ICD-10-CM | POA: Diagnosis not present

## 2017-12-20 DIAGNOSIS — I10 Essential (primary) hypertension: Secondary | ICD-10-CM | POA: Diagnosis not present

## 2017-12-20 NOTE — Progress Notes (Signed)
Subjective:    Patient ID: Ryan Daniel, male    DOB: [birth year], 67 y.o.   MRN: [id number]  HPI  67 year old patient who is seen today following a recent hospital discharge.  He was admitted to know about in [place] with the dehydration associated with acute renal failure metabolic acidosis and hypo-magnesiumemia.  Since his discharge he has done well.  He has regained his strength and is eating and drinking normally.  Due to the dehydration lisinopril and hydrochlorothiazide were discontinued now is on atenolol 100 mg daily for blood pressure control.  He has a history of B12 deficiency.  He also has a history of depressive disorder and is maintained on sertraline.  Hospital records reviewed.  Past Medical History:  Diagnosis Date  . DEPRESSION 02/05/2010  . GERD 02/05/2010  . HYPERTENSION 02/05/2010  . TOBACCO ABUSE 09/15/2010     Social History   Socioeconomic History  . Marital status: Single    Spouse name: Not on file  . Number of children: Not on file  . Years of education: Not on file  . Highest education level: Not on file  Social Needs  . Financial resource strain: Not on file  . Food insecurity - worry: Not on file  . Food insecurity - inability: Not on file  . Transportation needs - medical: Not on file  . Transportation needs - non-medical: Not on file  Occupational History  . Not on file  Tobacco Use  . Smoking status: Current Every Day Smoker    Packs/day: 0.50    Types: Cigarettes  . Smokeless tobacco: Never Used  Substance and Sexual Activity  . Alcohol use: Yes    Alcohol/week: 3.5 oz    Types: 7 Standard drinks or equivalent per week  . Drug use: No  . Sexual activity: Not on file  Other Topics Concern  . Not on file  Social History Narrative  . Not on file    History reviewed. No pertinent surgical history.  Family History  Problem Relation Age of Onset  . Cancer Maternal Grandmother        colon  . Heart disease Paternal Grandfather       Allergies  Allergen Reactions  . Oxycodone Nausea And Vomiting  . Amlodipine   . Prochlorperazine Edisylate     Current Outpatient Medications on File Prior to Visit  Medication Sig Dispense Refill  . atenolol (TENORMIN) 50 MG tablet Take 1 tablet (50 mg total) by mouth daily. (Patient taking differently: Take 100 mg by mouth daily. ) 90 tablet 3  . Multiple Vitamin (THERA) TABS Take 1 tablet by mouth daily.     Marland Kitchen omeprazole (PRILOSEC) 40 MG capsule Take 20 mg by mouth daily.     . sertraline (ZOLOFT) 50 MG tablet TAKE 1 TABLET (50 MG TOTAL) BY MOUTH DAILY. 30 tablet 0  . vitamin B-12 (CYANOCOBALAMIN) 1000 MCG tablet Take 1 tablet (1,000 mcg total) by mouth daily.     No current facility-administered medications on file prior to visit.     BP 122/70 (BP Location: Right Arm, Patient Position: Sitting, Cuff Size: Normal)   Pulse 73   Temp 98.8 F (37.1 C) (Oral)   Ht 5\' 10"  (1.778 m)   Wt 193 lb 9.6 oz (87.8 kg)   SpO2 100%   BMI 27.78 kg/m     Review of Systems  Constitutional: Positive for fatigue. Negative for appetite change, chills and fever.  HENT: Negative for congestion, dental problem, ear  pain, hearing loss, sore throat, tinnitus, trouble swallowing and voice change.   Eyes: Negative for pain, discharge and visual disturbance.  Respiratory: Negative for cough, chest tightness, wheezing and stridor.   Cardiovascular: Negative for chest pain, palpitations and leg swelling.  Gastrointestinal: Negative for abdominal distention, abdominal pain, blood in stool, constipation, diarrhea, nausea and vomiting.  Genitourinary: Negative for difficulty urinating, discharge, flank pain, genital sores, hematuria and urgency.  Musculoskeletal: Negative for arthralgias, back pain, gait problem, joint swelling, myalgias and neck stiffness.  Skin: Negative for rash.  Neurological: Negative for dizziness, syncope, speech difficulty, weakness, numbness and headaches.  Hematological:  Negative for adenopathy. Does not bruise/bleed easily.  Psychiatric/Behavioral: Negative for behavioral problems and dysphoric mood. The patient is not nervous/anxious.        Objective:   Physical Exam  Constitutional: He is oriented to person, place, and time. He appears well-developed.  HENT:  Head: Normocephalic.  Right Ear: External ear normal.  Left Ear: External ear normal.  Eyes: Conjunctivae and EOM are normal.  Neck: Normal range of motion.  Cardiovascular: Normal rate and normal heart sounds.  Pulmonary/Chest: Breath sounds normal.  Abdominal: Bowel sounds are normal.  Musculoskeletal: Normal range of motion. He exhibits no edema or tenderness.  Neurological: He is alert and oriented to person, place, and time.  Psychiatric: He has a normal mood and affect. His behavior is normal.          Assessment & Plan:   Status post acute diarrheal illness with acute renal failure.  Status post colonoscopy and polypectomy History of elevated LFTs in the setting of acute illness.  We will repeat liver function studies History of hypertension.  We will continue present regimen History of hypo-magnesiumemia-we will check follow-up magnesium level History of acute renal failure.  Will review renal indices  Follow-up 3 months  Nyoka Cowden

## 2017-12-20 NOTE — Patient Instructions (Signed)
Limit your sodium (Salt) intake  Please check your blood pressure on a regular basis.  If it is consistently greater than 150/90, please make an office appointment.  Return in 6 months for follow-up   

## 2017-12-21 ENCOUNTER — Other Ambulatory Visit: Payer: Self-pay | Admitting: Internal Medicine

## 2017-12-21 ENCOUNTER — Telehealth: Payer: Self-pay | Admitting: Family Medicine

## 2017-12-21 LAB — COMPREHENSIVE METABOLIC PANEL
ALK PHOS: 98 U/L (ref 39–117)
ALT: 13 U/L (ref 0–53)
AST: 18 U/L (ref 0–37)
Albumin: 3.7 g/dL (ref 3.5–5.2)
BILIRUBIN TOTAL: 0.4 mg/dL (ref 0.2–1.2)
BUN: 13 mg/dL (ref 6–23)
CO2: 29 mEq/L (ref 19–32)
CREATININE: 1.14 mg/dL (ref 0.40–1.50)
Calcium: 8.5 mg/dL (ref 8.4–10.5)
Chloride: 100 mEq/L (ref 96–112)
GFR: 68.32 mL/min (ref >60.00)
GLUCOSE: 81 mg/dL (ref 70–99)
POTASSIUM: 4.6 meq/L (ref 3.5–5.1)
SODIUM: 137 meq/L (ref 135–145)
TOTAL PROTEIN: 7.5 g/dL (ref 6.0–8.3)

## 2017-12-21 LAB — CBC WITH DIFFERENTIAL/PLATELET
BASOS ABS: 0.1 10*3/uL (ref 0.0–0.1)
Basophils Relative: 1.3 % (ref 0.0–3.0)
EOS ABS: 0.3 10*3/uL (ref 0.0–0.7)
Eosinophils Relative: 3.9 % (ref 0.0–5.0)
HCT: 38.5 % — ABNORMAL LOW (ref 39.0–52.0)
HEMOGLOBIN: 13 g/dL (ref 13.0–17.0)
LYMPHS ABS: 1.8 10*3/uL (ref 0.7–4.0)
Lymphocytes Relative: 21.9 % (ref 12.0–46.0)
MCHC: 33.7 g/dL (ref 30.0–36.0)
MCV: 94.7 fl (ref 78.0–100.0)
MONO ABS: 0.6 10*3/uL (ref 0.1–1.0)
Monocytes Relative: 7.8 % (ref 3.0–12.0)
NEUTROS PCT: 65.1 % (ref 43.0–77.0)
Neutro Abs: 5.4 10*3/uL (ref 1.4–7.7)
Platelets: 415 10*3/uL — ABNORMAL HIGH (ref 150.0–400.0)
RBC: 4.06 Mil/uL — AB (ref 4.22–5.81)
RDW: 12.9 % (ref 11.5–15.5)
WBC: 8.3 10*3/uL (ref 4.0–10.5)

## 2017-12-21 LAB — MAGNESIUM: Magnesium: 1 mg/dL — CL (ref 1.5–2.5)

## 2017-12-21 NOTE — Telephone Encounter (Signed)
Ask patient to increase magnesium oxide to twice daily for 1 month

## 2017-12-21 NOTE — Telephone Encounter (Signed)
I spoke with pt and he agreed to increase the magnesium and follow up in one month.

## 2017-12-21 NOTE — Telephone Encounter (Signed)
Call report for critical lab value-Magnesium 1.0. I will print and give to Dr. Raliegh Ip to review.

## 2017-12-22 ENCOUNTER — Other Ambulatory Visit: Payer: Self-pay | Admitting: Internal Medicine

## 2017-12-22 MED ORDER — MAGNESIUM OXIDE 400 MG PO TABS: 400.0000 mg | ORAL_TABLET | Freq: Two times a day (BID) | ORAL | 11 refills | Status: DC

## 2018-01-09 ENCOUNTER — Other Ambulatory Visit: Payer: Self-pay | Admitting: Internal Medicine

## 2018-03-08 ENCOUNTER — Telehealth: Payer: Self-pay | Admitting: Family Medicine

## 2018-03-08 NOTE — Telephone Encounter (Signed)
Copied from [place] [id number]. Topic: Inquiry >> Mar 08, 2018 10:12 AM Pricilla Handler wrote: Reason for CRM: Patient called stating that he needs a refill of Atenolol (TENORMIN) tablet. Patient wants it to be upgraded to 100mg  tablets. Patient stated that he now takes 100mg  a day, and has been taking 2 50mg  tablets daily. This has caused him to run out earlier than usual. Patient wants Atenolol (TENORMIN) 100 MG tablets (90 Day Supply) sent to his mail order pharmacy: [facility] [place].       Thank You!!!

## 2018-03-10 ENCOUNTER — Other Ambulatory Visit: Payer: Self-pay

## 2018-03-10 MED ORDER — ATENOLOL 100 MG PO TABS: 100.0000 mg | ORAL_TABLET | Freq: Every day | ORAL | 0 refills | Status: DC

## 2018-03-10 NOTE — Telephone Encounter (Signed)
Left detailed message stating that atenolol 100 mg has been sent in. However, its only 7 tablets until Dr.Kwiatkowski gets in the office 03/14/2018 to approve the quantity amount since this medication was only started due to dehydration per Dr.kwiatkowki's notes.

## 2018-03-14 ENCOUNTER — Other Ambulatory Visit: Payer: Self-pay

## 2018-03-14 MED ORDER — ATENOLOL 100 MG PO TABS: 100.0000 mg | ORAL_TABLET | Freq: Every day | ORAL | 2 refills | Status: DC

## 2018-03-14 NOTE — Telephone Encounter (Signed)
Medication sent in for 90 day supply. Patient aware of update.

## 2018-03-14 NOTE — Telephone Encounter (Signed)
Okay for atenolol 100 mg #90  1 daily

## 2018-04-24 ENCOUNTER — Other Ambulatory Visit: Payer: Self-pay | Admitting: Internal Medicine

## 2018-05-02 ENCOUNTER — Telehealth: Payer: Self-pay | Admitting: *Deleted

## 2018-05-02 NOTE — Telephone Encounter (Signed)
OK 

## 2018-05-02 NOTE — Telephone Encounter (Signed)
Pt is requesting that his care be transferred over to Dr Martinique after Dr Shan Levans.  Pt is not wanting to schedule at this time, just wants to get the okay to switch over to Dr Martinique.   Please advise Dr Raliegh Ip  -----  Pharmacy updated per pt request

## 2018-05-02 NOTE — Telephone Encounter (Signed)
Copied from [place] [id number]. Topic: General - Other >> May 02, 2018 12:33 PM Cecelia Byars, NT wrote: Reason for CRM: Patient called and would like to do a TOC  to  Dr Martinique due to Dr Burnice Logan leaving , he does not want to schedule an appointment at this time . In the future he does not want any of his prescriptions sent to [facility]

## 2018-05-03 NOTE — Telephone Encounter (Signed)
Please contact patient to schedule. He may want to go ahead and set up transfer appt around the time his next follow-up is due.

## 2018-05-03 NOTE — Telephone Encounter (Signed)
Dr Martinique please advise if you are okay with patient transferring care over to you. Thanks.

## 2018-05-03 NOTE — Telephone Encounter (Signed)
It is okay with me. Ryan Daniel

## 2018-10-20 ENCOUNTER — Other Ambulatory Visit: Payer: Self-pay | Admitting: Internal Medicine

## 2018-10-24 NOTE — Telephone Encounter (Signed)
Pt states he has about 1 week worth of zoloft left and he has scheduled with Dr. Martinique for 11/28/18. Pt would like to see if enough rx can be sent to [facility] to last until that appt?

## 2018-11-28 ENCOUNTER — Ambulatory Visit (INDEPENDENT_AMBULATORY_CARE_PROVIDER_SITE_OTHER): Payer: Medicare HMO | Admitting: Family Medicine

## 2018-11-28 ENCOUNTER — Encounter: Payer: Self-pay | Admitting: Family Medicine

## 2018-11-28 VITALS — BP 124/76 | HR 77 | Temp 98.4°F | Resp 12 | Ht 70.0 in | Wt 216.4 lb

## 2018-11-28 DIAGNOSIS — K219 Gastro-esophageal reflux disease without esophagitis: Secondary | ICD-10-CM | POA: Diagnosis not present

## 2018-11-28 DIAGNOSIS — E538 Deficiency of other specified B group vitamins: Secondary | ICD-10-CM | POA: Diagnosis not present

## 2018-11-28 DIAGNOSIS — F3341 Major depressive disorder, recurrent, in partial remission: Secondary | ICD-10-CM

## 2018-11-28 DIAGNOSIS — I10 Essential (primary) hypertension: Secondary | ICD-10-CM | POA: Diagnosis not present

## 2018-11-28 DIAGNOSIS — E782 Mixed hyperlipidemia: Secondary | ICD-10-CM | POA: Diagnosis not present

## 2018-11-28 MED ORDER — SERTRALINE HCL 50 MG PO TABS: 50.0000 mg | ORAL_TABLET | Freq: Every day | ORAL | 3 refills | Status: DC

## 2018-11-28 MED ORDER — ATENOLOL 100 MG PO TABS: 100.0000 mg | ORAL_TABLET | Freq: Every day | ORAL | 1 refills | Status: DC

## 2018-11-28 NOTE — Patient Instructions (Signed)
A few things to remember from today's visit:   Essential hypertension  Hypomagnesemia  B12 deficiency   Please be sure medication list is accurate. If a new problem present, please set up appointment sooner than planned today.

## 2018-11-28 NOTE — Assessment & Plan Note (Signed)
No changes in current management, will follow Mg and will give further recommendations accordingly.

## 2018-11-28 NOTE — Assessment & Plan Note (Signed)
Adequately controlled. No changes in current management. Low-salt diet to continue. Eye exam recommended annually, last done over a year ago. F/U in 6 months, before if needed.

## 2018-11-28 NOTE — Assessment & Plan Note (Signed)
Well-controlled with omeprazole 10 mg daily, so no changes in current management. GERD precautions to continue.

## 2018-11-28 NOTE — Progress Notes (Signed)
HPI:   [name] is a 68 y.o. male, who is here today to establish care.  Former PCP: Dr Burnice Logan Last preventive routine visit: 02/2017  Chronic medical problems: HTN,HLD,GERD,B12 def, and hypo Mg.  Depression,he is on Sertraline 50 mg daily. He has taken medication fro years. Denies depressed mood or suicidal thoughts. Problem is "ok."  He lives with his girlfriend.   Hypomagnesemia. Last Mg low at 1.0 on 12/21/17. He is on Mg Oxide 400 mg    B12 def,he is on B12 supplementation, 1000 mcg daily. He has not noted numbness,tingling,or MS changes.  Lab Results  Component Value Date   VITAMINB12 719 02/23/2017    Hypertension:  Currently on Atenolol 100 mg daily.   He was on Lisinopril and HCTZ until a year ago , discontinued due to AKI.  She is taking medications as instructed, no side effects reported.  He has not noted unusual headache, visual changes, exertional chest pain, dyspnea,  focal weakness, or edema.   Lab Results  Component Value Date   CREATININE 1.14 12/20/2017   BUN 13 12/20/2017   NA 137 12/20/2017   K 4.6 12/20/2017   CL 100 12/20/2017   CO2 29 12/20/2017     Hyperlipidemia: Currently on non pharmacologic treatment. Following a low fat diet: He tries to do so.Eats fried food about once per week.   Lab Results  Component Value Date   CHOL 247 (H) 02/23/2017   HDL 35.70 (L) 02/23/2017   LDLCALC 128 (H) 02/05/2010   LDLDIRECT 110.0 02/23/2017   TRIG (H) 02/23/2017    666.0 Triglyceride is over 400; calculations on Lipids are invalid.   CHOLHDL 7 02/23/2017   He drinks alcohol daily, 2 mixed drinks with bourbon daily.  GERD: He is on omeprazole 20 mg, takes 0.5 tablet daily.  He has tried to stop PPI but after 2 days or so he is start with heartburn.  Denies abdominal pain, nausea, vomiting, changes in bowel habits, blood in stool or melena.  Colonoscopy in 11/2017, tubular adenoma.   Review of Systems    Constitutional: Negative for activity change, appetite change, fatigue and fever.  HENT: Negative for nosebleeds, sore throat and trouble swallowing.   Eyes: Negative for redness and visual disturbance.  Respiratory: Negative for cough, shortness of breath and wheezing.   Cardiovascular: Negative for chest pain, palpitations and leg swelling.  Gastrointestinal: Negative for abdominal pain, nausea and vomiting.  Genitourinary: Negative for decreased urine volume and hematuria.  Neurological: Negative for syncope, weakness, numbness and headaches.  Psychiatric/Behavioral: Negative for confusion and suicidal ideas. The patient is nervous/anxious.      Current Outpatient Medications on File Prior to Visit  Medication Sig Dispense Refill  . magnesium oxide (MAG-OX) 400 MG tablet Take 1 tablet (400 mg total) by mouth 2 (two) times daily. 60 tablet 11  . Multiple Vitamin (THERA) TABS Take by mouth.    . OMEPRAZOLE PO Take 20 mg by mouth daily. Half a tablet (10 mg) daily    . vitamin B-12 (CYANOCOBALAMIN) 1000 MCG tablet Take 1 tablet (1,000 mcg total) by mouth daily.     No current facility-administered medications on file prior to visit.      Past Medical History:  Diagnosis Date  . DEPRESSION 02/05/2010  . GERD 02/05/2010  . HYPERTENSION 02/05/2010  . TOBACCO ABUSE 09/15/2010   Allergies  Allergen Reactions  . Oxycodone Nausea And Vomiting  . Amlodipine   . Prochlorperazine Edisylate  Family History  Problem Relation Age of Onset  . Cancer Maternal Grandmother        colon  . Heart disease Paternal Grandfather     Social History   Socioeconomic History  . Marital status: Single    Spouse name: Not on file  . Number of children: Not on file  . Years of education: Not on file  . Highest education level: Not on file  Occupational History  . Not on file  Social Needs  . Financial resource strain: Not on file  . Food insecurity:    Worry: Not on file    Inability:  Not on file  . Transportation needs:    Medical: Not on file    Non-medical: Not on file  Tobacco Use  . Smoking status: Current Every Day Smoker    Packs/day: 0.50    Types: Cigarettes  . Smokeless tobacco: Never Used  Substance and Sexual Activity  . Alcohol use: Yes    Alcohol/week: 7.0 standard drinks    Types: 7 Standard drinks or equivalent per week  . Drug use: No  . Sexual activity: Not on file  Lifestyle  . Physical activity:    Days per week: Not on file    Minutes per session: Not on file  . Stress: Not on file  Relationships  . Social connections:    Talks on phone: Not on file    Gets together: Not on file    Attends religious service: Not on file    Active member of club or organization: Not on file    Attends meetings of clubs or organizations: Not on file    Relationship status: Not on file  Other Topics Concern  . Not on file  Social History Narrative  . Not on file    Vitals:   11/28/18 1505  BP: 124/76  Pulse: 77  Resp: 12  Temp: 98.4 F (36.9 C)  SpO2: 97%    Body mass index is 31.05 kg/m.   Physical Exam  Nursing note and vitals reviewed. Constitutional: He is oriented to person, place, and time. He appears well-developed. No distress.  HENT:  Head: Normocephalic and atraumatic.  Mouth/Throat: Oropharynx is clear and moist and mucous membranes are normal.  Eyes: Pupils are equal, round, and reactive to light. Conjunctivae are normal.  Cardiovascular: Normal rate and regular rhythm.  No murmur heard. Pulses:      Dorsalis pedis pulses are 2+ on the right side and 2+ on the left side.  Respiratory: Effort normal and breath sounds normal. No respiratory distress.  GI: Soft. He exhibits no mass. There is no hepatomegaly. There is no abdominal tenderness.  Musculoskeletal:        General: No edema.  Lymphadenopathy:    He has no cervical adenopathy.  Neurological: He is alert and oriented to person, place, and time. No cranial nerve  deficit.  No focal deficit appreciated. Mildly unstable gait, not assisted.  Skin: Skin is warm. No rash noted. No erythema.  Psychiatric: He has a normal mood and affect.  Well groomed, good eye contact.      ASSESSMENT AND PLAN:  Ryan Daniel was seen today for transfer of care.  Orders Placed This Encounter  Procedures  . Magnesium  . Basic metabolic panel  . Vitamin B12   Lab Results  Component Value Date   [id number] 563 11/28/2018   Lab Results  Component Value Date   CREATININE 1.36 11/28/2018   BUN 18  11/28/2018   NA 139 11/28/2018   K 4.3 11/28/2018   CL 99 11/28/2018   CO2 30 11/28/2018    Hypomagnesemia No changes in current management, will follow Mg and will give further recommendations accordingly.   Hyperlipidemia, mixed For now recommend continue with a low-fat diet. He is not fasting today, so we will plan on lipid panel next visit.  GERD Well-controlled with omeprazole 10 mg daily, so no changes in current management. GERD precautions to continue.  Essential hypertension Adequately controlled. No changes in current management. Low-salt diet to continue. Eye exam recommended annually, last done over a year ago. F/U in 6 months, before if needed.   B12 deficiency No changes in B12 supplementation.  We will follow B12 done today and will give recommendations accordingly.   Depression, major, recurrent, in partial remission ([place]) Reporting problem has a stable with sertraline 50 mg daily. No changes in current management. Follow-up in 6 months, before if needed.     Return in about 6 months (around 05/29/2019) for cpe,fasting labs..       Betty G. Martinique, MD  [facility]. [place] office.

## 2018-11-28 NOTE — Assessment & Plan Note (Signed)
Reporting problem has a stable with sertraline 50 mg daily. No changes in current management. Follow-up in 6 months, before if needed.

## 2018-11-28 NOTE — Assessment & Plan Note (Signed)
No changes in B12 supplementation.  We will follow B12 done today and will give recommendations accordingly.

## 2018-11-28 NOTE — Assessment & Plan Note (Signed)
For now recommend continue with a low-fat diet. He is not fasting today, so we will plan on lipid panel next visit.

## 2018-11-29 LAB — BASIC METABOLIC PANEL
BUN: 18 mg/dL (ref 6–23)
CALCIUM: 9.4 mg/dL (ref 8.4–10.5)
CO2: 30 mEq/L (ref 19–32)
CREATININE: 1.36 mg/dL (ref 0.40–1.50)
Chloride: 99 mEq/L (ref 96–112)
GFR: 52.29 mL/min — ABNORMAL LOW (ref >60.00)
Glucose, Bld: 100 mg/dL — ABNORMAL HIGH (ref 70–99)
Potassium: 4.3 mEq/L (ref 3.5–5.1)
Sodium: 139 mEq/L (ref 135–145)

## 2018-11-29 LAB — MAGNESIUM: MAGNESIUM: 1.2 mg/dL — AB (ref 1.5–2.5)

## 2018-12-01 ENCOUNTER — Encounter: Payer: Self-pay | Admitting: Family Medicine

## 2018-12-01 LAB — VITAMIN B12: Vitamin B-12: 706 pg/mL (ref 211–911)

## 2018-12-19 DIAGNOSIS — H2513 Age-related nuclear cataract, bilateral: Secondary | ICD-10-CM | POA: Diagnosis not present

## 2018-12-19 DIAGNOSIS — H527 Unspecified disorder of refraction: Secondary | ICD-10-CM | POA: Diagnosis not present

## 2019-02-09 ENCOUNTER — Telehealth: Payer: Self-pay

## 2019-02-09 NOTE — Telephone Encounter (Signed)
Author phoned pt. to assess interest in scheduling virtual awv. No answer, author left detailed VM asking for return call.   

## 2019-05-29 ENCOUNTER — Other Ambulatory Visit: Payer: Medicare HMO

## 2019-06-21 DIAGNOSIS — H53451 Other localized visual field defect, right eye: Secondary | ICD-10-CM | POA: Diagnosis not present

## 2019-06-21 DIAGNOSIS — H4711 Papilledema associated with increased intracranial pressure: Secondary | ICD-10-CM | POA: Diagnosis not present

## 2019-06-26 DIAGNOSIS — H53461 Homonymous bilateral field defects, right side: Secondary | ICD-10-CM | POA: Diagnosis not present

## 2019-06-26 DIAGNOSIS — G932 Benign intracranial hypertension: Secondary | ICD-10-CM | POA: Diagnosis not present

## 2019-06-26 DIAGNOSIS — H4710 Unspecified papilledema: Secondary | ICD-10-CM | POA: Diagnosis not present

## 2019-06-28 DIAGNOSIS — H4710 Unspecified papilledema: Secondary | ICD-10-CM | POA: Diagnosis not present

## 2019-06-29 DIAGNOSIS — H4710 Unspecified papilledema: Secondary | ICD-10-CM | POA: Diagnosis not present

## 2019-06-29 DIAGNOSIS — G932 Benign intracranial hypertension: Secondary | ICD-10-CM | POA: Diagnosis not present

## 2019-06-29 DIAGNOSIS — H5340 Unspecified visual field defects: Secondary | ICD-10-CM | POA: Diagnosis not present

## 2019-07-13 DIAGNOSIS — H4710 Unspecified papilledema: Secondary | ICD-10-CM | POA: Diagnosis not present

## 2019-07-13 DIAGNOSIS — H5340 Unspecified visual field defects: Secondary | ICD-10-CM | POA: Diagnosis not present

## 2019-07-13 DIAGNOSIS — G932 Benign intracranial hypertension: Secondary | ICD-10-CM | POA: Diagnosis not present

## 2019-08-10 DIAGNOSIS — H4710 Unspecified papilledema: Secondary | ICD-10-CM | POA: Diagnosis not present

## 2019-08-10 DIAGNOSIS — G932 Benign intracranial hypertension: Secondary | ICD-10-CM | POA: Diagnosis not present

## 2019-08-10 DIAGNOSIS — H5340 Unspecified visual field defects: Secondary | ICD-10-CM | POA: Diagnosis not present

## 2019-09-12 DIAGNOSIS — R69 Illness, unspecified: Secondary | ICD-10-CM | POA: Diagnosis not present

## 2019-09-22 ENCOUNTER — Other Ambulatory Visit: Payer: Self-pay | Admitting: Family Medicine

## 2019-09-22 DIAGNOSIS — I10 Essential (primary) hypertension: Secondary | ICD-10-CM

## 2019-10-09 DIAGNOSIS — T8131XA Disruption of external operation (surgical) wound, not elsewhere classified, initial encounter: Secondary | ICD-10-CM | POA: Diagnosis not present

## 2019-10-09 DIAGNOSIS — N138 Other obstructive and reflux uropathy: Secondary | ICD-10-CM | POA: Diagnosis not present

## 2019-10-09 DIAGNOSIS — K660 Peritoneal adhesions (postprocedural) (postinfection): Secondary | ICD-10-CM | POA: Diagnosis not present

## 2019-10-09 DIAGNOSIS — E876 Hypokalemia: Secondary | ICD-10-CM | POA: Diagnosis not present

## 2019-10-09 DIAGNOSIS — R1013 Epigastric pain: Secondary | ICD-10-CM | POA: Diagnosis not present

## 2019-10-09 DIAGNOSIS — J986 Disorders of diaphragm: Secondary | ICD-10-CM | POA: Diagnosis not present

## 2019-10-09 DIAGNOSIS — I10 Essential (primary) hypertension: Secondary | ICD-10-CM | POA: Diagnosis not present

## 2019-10-09 DIAGNOSIS — Z452 Encounter for adjustment and management of vascular access device: Secondary | ICD-10-CM | POA: Diagnosis not present

## 2019-10-09 DIAGNOSIS — K469 Unspecified abdominal hernia without obstruction or gangrene: Secondary | ICD-10-CM | POA: Diagnosis not present

## 2019-10-09 DIAGNOSIS — Z9049 Acquired absence of other specified parts of digestive tract: Secondary | ICD-10-CM | POA: Diagnosis not present

## 2019-10-09 DIAGNOSIS — R197 Diarrhea, unspecified: Secondary | ICD-10-CM | POA: Diagnosis not present

## 2019-10-09 DIAGNOSIS — K56609 Unspecified intestinal obstruction, unspecified as to partial versus complete obstruction: Secondary | ICD-10-CM | POA: Diagnosis not present

## 2019-10-09 DIAGNOSIS — G932 Benign intracranial hypertension: Secondary | ICD-10-CM | POA: Diagnosis not present

## 2019-10-09 DIAGNOSIS — R609 Edema, unspecified: Secondary | ICD-10-CM | POA: Diagnosis not present

## 2019-10-09 DIAGNOSIS — N390 Urinary tract infection, site not specified: Secondary | ICD-10-CM | POA: Diagnosis not present

## 2019-10-09 DIAGNOSIS — K436 Other and unspecified ventral hernia with obstruction, without gangrene: Secondary | ICD-10-CM | POA: Diagnosis not present

## 2019-10-09 DIAGNOSIS — R140 Abdominal distension (gaseous): Secondary | ICD-10-CM | POA: Diagnosis not present

## 2019-10-09 DIAGNOSIS — K631 Perforation of intestine (nontraumatic): Secondary | ICD-10-CM | POA: Diagnosis not present

## 2019-10-09 DIAGNOSIS — K651 Peritoneal abscess: Secondary | ICD-10-CM | POA: Diagnosis not present

## 2019-10-09 DIAGNOSIS — K668 Other specified disorders of peritoneum: Secondary | ICD-10-CM | POA: Diagnosis not present

## 2019-10-09 DIAGNOSIS — Z9181 History of falling: Secondary | ICD-10-CM | POA: Diagnosis not present

## 2019-10-09 DIAGNOSIS — T8132XA Disruption of internal operation (surgical) wound, not elsewhere classified, initial encounter: Secondary | ICD-10-CM | POA: Diagnosis not present

## 2019-10-09 DIAGNOSIS — K565 Intestinal adhesions [bands], unspecified as to partial versus complete obstruction: Secondary | ICD-10-CM | POA: Diagnosis not present

## 2019-10-09 DIAGNOSIS — T83718A Erosion of other implanted mesh and other prosthetic materials to surrounding organ or tissue, initial encounter: Secondary | ICD-10-CM | POA: Diagnosis not present

## 2019-10-09 DIAGNOSIS — A419 Sepsis, unspecified organism: Secondary | ICD-10-CM | POA: Diagnosis not present

## 2019-10-09 DIAGNOSIS — R339 Retention of urine, unspecified: Secondary | ICD-10-CM | POA: Diagnosis not present

## 2019-10-09 DIAGNOSIS — R262 Difficulty in walking, not elsewhere classified: Secondary | ICD-10-CM | POA: Diagnosis not present

## 2019-10-09 DIAGNOSIS — K567 Ileus, unspecified: Secondary | ICD-10-CM | POA: Diagnosis not present

## 2019-10-09 DIAGNOSIS — R112 Nausea with vomiting, unspecified: Secondary | ICD-10-CM | POA: Diagnosis not present

## 2019-10-09 DIAGNOSIS — N400 Enlarged prostate without lower urinary tract symptoms: Secondary | ICD-10-CM | POA: Diagnosis not present

## 2019-10-09 DIAGNOSIS — I700 Atherosclerosis of aorta: Secondary | ICD-10-CM | POA: Diagnosis not present

## 2019-10-09 DIAGNOSIS — Z48815 Encounter for surgical aftercare following surgery on the digestive system: Secondary | ICD-10-CM | POA: Diagnosis not present

## 2019-10-09 DIAGNOSIS — F329 Major depressive disorder, single episode, unspecified: Secondary | ICD-10-CM | POA: Diagnosis not present

## 2019-10-09 DIAGNOSIS — M6281 Muscle weakness (generalized): Secondary | ICD-10-CM | POA: Diagnosis not present

## 2019-10-09 DIAGNOSIS — K5989 Other specified functional intestinal disorders: Secondary | ICD-10-CM | POA: Diagnosis not present

## 2019-10-09 DIAGNOSIS — Z7901 Long term (current) use of anticoagulants: Secondary | ICD-10-CM | POA: Diagnosis not present

## 2019-10-09 DIAGNOSIS — Z431 Encounter for attention to gastrostomy: Secondary | ICD-10-CM | POA: Diagnosis not present

## 2019-10-09 DIAGNOSIS — N179 Acute kidney failure, unspecified: Secondary | ICD-10-CM | POA: Diagnosis not present

## 2019-10-09 DIAGNOSIS — K219 Gastro-esophageal reflux disease without esophagitis: Secondary | ICD-10-CM | POA: Diagnosis not present

## 2019-10-09 DIAGNOSIS — N401 Enlarged prostate with lower urinary tract symptoms: Secondary | ICD-10-CM | POA: Diagnosis not present

## 2019-10-09 DIAGNOSIS — K7689 Other specified diseases of liver: Secondary | ICD-10-CM | POA: Diagnosis not present

## 2019-10-10 ENCOUNTER — Other Ambulatory Visit: Payer: Self-pay | Admitting: Family Medicine

## 2019-10-10 DIAGNOSIS — F3341 Major depressive disorder, recurrent, in partial remission: Secondary | ICD-10-CM

## 2019-11-22 DIAGNOSIS — I10 Essential (primary) hypertension: Secondary | ICD-10-CM | POA: Diagnosis not present

## 2019-11-22 DIAGNOSIS — Z7901 Long term (current) use of anticoagulants: Secondary | ICD-10-CM | POA: Diagnosis not present

## 2019-11-22 DIAGNOSIS — M6281 Muscle weakness (generalized): Secondary | ICD-10-CM | POA: Diagnosis not present

## 2019-11-22 DIAGNOSIS — K631 Perforation of intestine (nontraumatic): Secondary | ICD-10-CM | POA: Diagnosis not present

## 2019-11-22 DIAGNOSIS — Z7189 Other specified counseling: Secondary | ICD-10-CM | POA: Diagnosis not present

## 2019-11-22 DIAGNOSIS — Z9049 Acquired absence of other specified parts of digestive tract: Secondary | ICD-10-CM | POA: Diagnosis not present

## 2019-11-22 DIAGNOSIS — Z9889 Other specified postprocedural states: Secondary | ICD-10-CM | POA: Diagnosis not present

## 2019-11-22 DIAGNOSIS — R339 Retention of urine, unspecified: Secondary | ICD-10-CM | POA: Diagnosis not present

## 2019-11-22 DIAGNOSIS — R197 Diarrhea, unspecified: Secondary | ICD-10-CM | POA: Diagnosis not present

## 2019-11-22 DIAGNOSIS — Z48815 Encounter for surgical aftercare following surgery on the digestive system: Secondary | ICD-10-CM | POA: Diagnosis not present

## 2019-11-22 DIAGNOSIS — N179 Acute kidney failure, unspecified: Secondary | ICD-10-CM | POA: Diagnosis not present

## 2019-11-22 DIAGNOSIS — R5381 Other malaise: Secondary | ICD-10-CM | POA: Diagnosis not present

## 2019-11-22 DIAGNOSIS — K56609 Unspecified intestinal obstruction, unspecified as to partial versus complete obstruction: Secondary | ICD-10-CM | POA: Diagnosis not present

## 2019-11-22 DIAGNOSIS — R198 Other specified symptoms and signs involving the digestive system and abdomen: Secondary | ICD-10-CM | POA: Diagnosis not present

## 2019-11-22 DIAGNOSIS — F329 Major depressive disorder, single episode, unspecified: Secondary | ICD-10-CM | POA: Diagnosis not present

## 2019-11-22 DIAGNOSIS — R112 Nausea with vomiting, unspecified: Secondary | ICD-10-CM | POA: Diagnosis not present

## 2019-11-22 DIAGNOSIS — K567 Ileus, unspecified: Secondary | ICD-10-CM | POA: Diagnosis not present

## 2019-11-22 DIAGNOSIS — R262 Difficulty in walking, not elsewhere classified: Secondary | ICD-10-CM | POA: Diagnosis not present

## 2019-11-22 DIAGNOSIS — E876 Hypokalemia: Secondary | ICD-10-CM | POA: Diagnosis not present

## 2019-11-22 DIAGNOSIS — R109 Unspecified abdominal pain: Secondary | ICD-10-CM | POA: Diagnosis not present

## 2019-11-22 DIAGNOSIS — Z9181 History of falling: Secondary | ICD-10-CM | POA: Diagnosis not present

## 2019-11-22 DIAGNOSIS — K5989 Other specified functional intestinal disorders: Secondary | ICD-10-CM | POA: Diagnosis not present

## 2019-11-22 DIAGNOSIS — K529 Noninfective gastroenteritis and colitis, unspecified: Secondary | ICD-10-CM | POA: Diagnosis not present

## 2019-11-22 DIAGNOSIS — S31609A Unspecified open wound of abdominal wall, unspecified quadrant with penetration into peritoneal cavity, initial encounter: Secondary | ICD-10-CM | POA: Diagnosis not present

## 2019-11-22 DIAGNOSIS — K219 Gastro-esophageal reflux disease without esophagitis: Secondary | ICD-10-CM | POA: Diagnosis not present

## 2019-11-22 DIAGNOSIS — K651 Peritoneal abscess: Secondary | ICD-10-CM | POA: Diagnosis not present

## 2019-11-22 DIAGNOSIS — R609 Edema, unspecified: Secondary | ICD-10-CM | POA: Diagnosis not present

## 2019-11-23 DIAGNOSIS — K651 Peritoneal abscess: Secondary | ICD-10-CM | POA: Diagnosis not present

## 2019-11-23 DIAGNOSIS — K56609 Unspecified intestinal obstruction, unspecified as to partial versus complete obstruction: Secondary | ICD-10-CM | POA: Diagnosis not present

## 2019-11-23 DIAGNOSIS — K219 Gastro-esophageal reflux disease without esophagitis: Secondary | ICD-10-CM | POA: Diagnosis not present

## 2019-11-23 DIAGNOSIS — I10 Essential (primary) hypertension: Secondary | ICD-10-CM | POA: Diagnosis not present

## 2019-11-23 MED ORDER — TAMSULOSIN HCL 0.4 MG PO CAPS
0.40 | ORAL_CAPSULE | ORAL | Status: DC
Start: 2019-11-23 — End: 2019-11-23

## 2019-11-23 MED ORDER — ACETAMINOPHEN 500 MG PO TABS
500.00 | ORAL_TABLET | ORAL | Status: DC
Start: ? — End: 2019-11-23

## 2019-11-23 MED ORDER — ATENOLOL 50 MG PO TABS
100.00 | ORAL_TABLET | ORAL | Status: DC
Start: 2019-11-23 — End: 2019-11-23

## 2019-11-23 MED ORDER — FINASTERIDE 5 MG PO TABS
5.00 | ORAL_TABLET | ORAL | Status: DC
Start: 2019-11-22 — End: 2019-11-23

## 2019-11-23 MED ORDER — ENOXAPARIN SODIUM 40 MG/0.4ML ~~LOC~~ SOLN
40.00 | SUBCUTANEOUS | Status: DC
Start: 2019-11-23 — End: 2019-11-23

## 2019-11-23 MED ORDER — POTASSIUM CHLORIDE CRYS ER 20 MEQ PO TBCR
40.00 | EXTENDED_RELEASE_TABLET | ORAL | Status: DC
Start: 2019-11-22 — End: 2019-11-23

## 2019-11-23 MED ORDER — GENERIC EXTERNAL MEDICATION
Status: DC
Start: ? — End: 2019-11-23

## 2019-11-23 MED ORDER — COLESEVELAM HCL 625 MG PO TABS
1875.00 | ORAL_TABLET | ORAL | Status: DC
Start: 2019-11-23 — End: 2019-11-23

## 2019-11-23 MED ORDER — METHOCARBAMOL 500 MG PO TABS
500.00 | ORAL_TABLET | ORAL | Status: DC
Start: 2019-11-22 — End: 2019-11-23

## 2019-11-23 MED ORDER — LISINOPRIL 5 MG PO TABS
2.50 | ORAL_TABLET | ORAL | Status: DC
Start: 2019-11-23 — End: 2019-11-23

## 2019-11-23 MED ORDER — SPIRONOLACTONE 25 MG PO TABS
25.00 | ORAL_TABLET | ORAL | Status: DC
Start: 2019-11-23 — End: 2019-11-23

## 2019-11-23 MED ORDER — SODIUM CHLORIDE 0.9 % IV SOLN
10.00 | INTRAVENOUS | Status: DC
Start: ? — End: 2019-11-23

## 2019-11-24 DIAGNOSIS — K651 Peritoneal abscess: Secondary | ICD-10-CM | POA: Diagnosis not present

## 2019-11-24 DIAGNOSIS — R198 Other specified symptoms and signs involving the digestive system and abdomen: Secondary | ICD-10-CM | POA: Diagnosis not present

## 2019-11-24 DIAGNOSIS — K567 Ileus, unspecified: Secondary | ICD-10-CM | POA: Diagnosis not present

## 2019-11-24 DIAGNOSIS — K529 Noninfective gastroenteritis and colitis, unspecified: Secondary | ICD-10-CM | POA: Diagnosis not present

## 2019-11-24 DIAGNOSIS — Z9889 Other specified postprocedural states: Secondary | ICD-10-CM | POA: Diagnosis not present

## 2019-11-24 DIAGNOSIS — N179 Acute kidney failure, unspecified: Secondary | ICD-10-CM | POA: Diagnosis not present

## 2019-11-24 DIAGNOSIS — I10 Essential (primary) hypertension: Secondary | ICD-10-CM | POA: Diagnosis not present

## 2019-11-24 DIAGNOSIS — S31609A Unspecified open wound of abdominal wall, unspecified quadrant with penetration into peritoneal cavity, initial encounter: Secondary | ICD-10-CM | POA: Diagnosis not present

## 2019-11-24 DIAGNOSIS — K5989 Other specified functional intestinal disorders: Secondary | ICD-10-CM | POA: Diagnosis not present

## 2019-11-24 DIAGNOSIS — E876 Hypokalemia: Secondary | ICD-10-CM | POA: Diagnosis not present

## 2019-11-24 DIAGNOSIS — F329 Major depressive disorder, single episode, unspecified: Secondary | ICD-10-CM | POA: Diagnosis not present

## 2019-11-24 DIAGNOSIS — R5381 Other malaise: Secondary | ICD-10-CM | POA: Diagnosis not present

## 2019-11-24 DIAGNOSIS — Z7189 Other specified counseling: Secondary | ICD-10-CM | POA: Diagnosis not present

## 2019-11-24 DIAGNOSIS — Z9049 Acquired absence of other specified parts of digestive tract: Secondary | ICD-10-CM | POA: Diagnosis not present

## 2019-11-24 DIAGNOSIS — K219 Gastro-esophageal reflux disease without esophagitis: Secondary | ICD-10-CM | POA: Diagnosis not present

## 2019-11-27 DIAGNOSIS — K567 Ileus, unspecified: Secondary | ICD-10-CM | POA: Diagnosis not present

## 2019-11-27 DIAGNOSIS — I10 Essential (primary) hypertension: Secondary | ICD-10-CM | POA: Diagnosis not present

## 2019-11-27 DIAGNOSIS — N179 Acute kidney failure, unspecified: Secondary | ICD-10-CM | POA: Diagnosis not present

## 2019-11-27 DIAGNOSIS — E876 Hypokalemia: Secondary | ICD-10-CM | POA: Diagnosis not present

## 2019-11-28 DIAGNOSIS — E876 Hypokalemia: Secondary | ICD-10-CM | POA: Diagnosis not present

## 2019-11-28 DIAGNOSIS — N179 Acute kidney failure, unspecified: Secondary | ICD-10-CM | POA: Diagnosis not present

## 2019-11-28 DIAGNOSIS — I10 Essential (primary) hypertension: Secondary | ICD-10-CM | POA: Diagnosis not present

## 2019-11-28 DIAGNOSIS — R197 Diarrhea, unspecified: Secondary | ICD-10-CM | POA: Diagnosis not present

## 2019-11-28 DIAGNOSIS — R5381 Other malaise: Secondary | ICD-10-CM | POA: Diagnosis not present

## 2019-11-28 DIAGNOSIS — K567 Ileus, unspecified: Secondary | ICD-10-CM | POA: Diagnosis not present

## 2019-11-29 DIAGNOSIS — D649 Anemia, unspecified: Secondary | ICD-10-CM | POA: Diagnosis not present

## 2019-11-29 DIAGNOSIS — K567 Ileus, unspecified: Secondary | ICD-10-CM | POA: Diagnosis not present

## 2019-11-29 DIAGNOSIS — J069 Acute upper respiratory infection, unspecified: Secondary | ICD-10-CM | POA: Diagnosis not present

## 2019-11-29 DIAGNOSIS — R109 Unspecified abdominal pain: Secondary | ICD-10-CM | POA: Diagnosis not present

## 2019-11-29 DIAGNOSIS — Z20828 Contact with and (suspected) exposure to other viral communicable diseases: Secondary | ICD-10-CM | POA: Diagnosis not present

## 2019-11-29 DIAGNOSIS — K9189 Other postprocedural complications and disorders of digestive system: Secondary | ICD-10-CM | POA: Diagnosis not present

## 2019-11-29 DIAGNOSIS — Z9181 History of falling: Secondary | ICD-10-CM | POA: Diagnosis not present

## 2019-11-29 DIAGNOSIS — E039 Hypothyroidism, unspecified: Secondary | ICD-10-CM | POA: Diagnosis not present

## 2019-11-29 DIAGNOSIS — I9589 Other hypotension: Secondary | ICD-10-CM | POA: Diagnosis not present

## 2019-11-29 DIAGNOSIS — R339 Retention of urine, unspecified: Secondary | ICD-10-CM | POA: Diagnosis not present

## 2019-11-29 DIAGNOSIS — Z9889 Other specified postprocedural states: Secondary | ICD-10-CM | POA: Diagnosis not present

## 2019-11-29 DIAGNOSIS — Z9049 Acquired absence of other specified parts of digestive tract: Secondary | ICD-10-CM | POA: Diagnosis not present

## 2019-11-29 DIAGNOSIS — Z79899 Other long term (current) drug therapy: Secondary | ICD-10-CM | POA: Diagnosis not present

## 2019-11-29 DIAGNOSIS — R5381 Other malaise: Secondary | ICD-10-CM | POA: Diagnosis not present

## 2019-11-29 DIAGNOSIS — K529 Noninfective gastroenteritis and colitis, unspecified: Secondary | ICD-10-CM | POA: Diagnosis not present

## 2019-11-29 DIAGNOSIS — K651 Peritoneal abscess: Secondary | ICD-10-CM | POA: Diagnosis not present

## 2019-11-29 DIAGNOSIS — E568 Deficiency of other vitamins: Secondary | ICD-10-CM | POA: Diagnosis not present

## 2019-11-29 DIAGNOSIS — Z885 Allergy status to narcotic agent status: Secondary | ICD-10-CM | POA: Diagnosis not present

## 2019-11-29 DIAGNOSIS — Z932 Ileostomy status: Secondary | ICD-10-CM | POA: Diagnosis not present

## 2019-11-29 DIAGNOSIS — I10 Essential (primary) hypertension: Secondary | ICD-10-CM | POA: Diagnosis not present

## 2019-11-29 DIAGNOSIS — K219 Gastro-esophageal reflux disease without esophagitis: Secondary | ICD-10-CM | POA: Diagnosis not present

## 2019-11-29 DIAGNOSIS — R609 Edema, unspecified: Secondary | ICD-10-CM | POA: Diagnosis not present

## 2019-11-29 DIAGNOSIS — D519 Vitamin B12 deficiency anemia, unspecified: Secondary | ICD-10-CM | POA: Diagnosis not present

## 2019-11-29 DIAGNOSIS — E876 Hypokalemia: Secondary | ICD-10-CM | POA: Diagnosis not present

## 2019-11-29 DIAGNOSIS — R638 Other symptoms and signs concerning food and fluid intake: Secondary | ICD-10-CM | POA: Diagnosis not present

## 2019-11-29 DIAGNOSIS — R531 Weakness: Secondary | ICD-10-CM | POA: Diagnosis not present

## 2019-11-29 DIAGNOSIS — F329 Major depressive disorder, single episode, unspecified: Secondary | ICD-10-CM | POA: Diagnosis not present

## 2019-11-29 DIAGNOSIS — R197 Diarrhea, unspecified: Secondary | ICD-10-CM | POA: Diagnosis not present

## 2019-11-29 DIAGNOSIS — Z48815 Encounter for surgical aftercare following surgery on the digestive system: Secondary | ICD-10-CM | POA: Diagnosis not present

## 2019-11-29 DIAGNOSIS — R110 Nausea: Secondary | ICD-10-CM | POA: Diagnosis not present

## 2019-11-29 DIAGNOSIS — R262 Difficulty in walking, not elsewhere classified: Secondary | ICD-10-CM | POA: Diagnosis not present

## 2019-11-29 DIAGNOSIS — Z87891 Personal history of nicotine dependence: Secondary | ICD-10-CM | POA: Diagnosis not present

## 2019-11-29 DIAGNOSIS — K5649 Other impaction of intestine: Secondary | ICD-10-CM | POA: Diagnosis not present

## 2019-11-29 DIAGNOSIS — E559 Vitamin D deficiency, unspecified: Secondary | ICD-10-CM | POA: Diagnosis not present

## 2019-11-29 DIAGNOSIS — R1013 Epigastric pain: Secondary | ICD-10-CM | POA: Diagnosis not present

## 2019-11-29 DIAGNOSIS — Z7901 Long term (current) use of anticoagulants: Secondary | ICD-10-CM | POA: Diagnosis not present

## 2019-11-29 DIAGNOSIS — K315 Obstruction of duodenum: Secondary | ICD-10-CM | POA: Diagnosis not present

## 2019-11-29 DIAGNOSIS — I959 Hypotension, unspecified: Secondary | ICD-10-CM | POA: Diagnosis not present

## 2019-11-29 DIAGNOSIS — N179 Acute kidney failure, unspecified: Secondary | ICD-10-CM | POA: Diagnosis not present

## 2019-11-29 DIAGNOSIS — M6281 Muscle weakness (generalized): Secondary | ICD-10-CM | POA: Diagnosis not present

## 2019-11-29 DIAGNOSIS — E119 Type 2 diabetes mellitus without complications: Secondary | ICD-10-CM | POA: Diagnosis not present

## 2019-11-30 DIAGNOSIS — K315 Obstruction of duodenum: Secondary | ICD-10-CM | POA: Diagnosis not present

## 2019-11-30 DIAGNOSIS — K651 Peritoneal abscess: Secondary | ICD-10-CM | POA: Diagnosis not present

## 2019-11-30 DIAGNOSIS — I10 Essential (primary) hypertension: Secondary | ICD-10-CM | POA: Diagnosis not present

## 2019-11-30 DIAGNOSIS — K219 Gastro-esophageal reflux disease without esophagitis: Secondary | ICD-10-CM | POA: Diagnosis not present

## 2019-11-30 MED ORDER — POTASSIUM CHLORIDE 20 MEQ PO PACK
40.00 | PACK | ORAL | Status: DC
Start: 2019-11-29 — End: 2019-11-30

## 2019-11-30 MED ORDER — SODIUM CHLORIDE 0.9 % IV SOLN
10.00 | INTRAVENOUS | Status: DC
Start: ? — End: 2019-11-30

## 2019-11-30 MED ORDER — COLESEVELAM HCL 625 MG PO TABS
1875.00 | ORAL_TABLET | ORAL | Status: DC
Start: 2019-11-30 — End: 2019-11-30

## 2019-11-30 MED ORDER — SERTRALINE HCL 50 MG PO TABS
50.00 | ORAL_TABLET | ORAL | Status: DC
Start: 2019-11-30 — End: 2019-11-30

## 2019-11-30 MED ORDER — ONDANSETRON HCL 4 MG/2ML IJ SOLN
4.00 | INTRAMUSCULAR | Status: DC
Start: ? — End: 2019-11-30

## 2019-11-30 MED ORDER — GENERIC EXTERNAL MEDICATION
Status: DC
Start: ? — End: 2019-11-30

## 2019-11-30 MED ORDER — LISINOPRIL 5 MG PO TABS
5.00 | ORAL_TABLET | ORAL | Status: DC
Start: 2019-11-30 — End: 2019-11-30

## 2019-11-30 MED ORDER — SPIRONOLACTONE 25 MG PO TABS
25.00 | ORAL_TABLET | ORAL | Status: DC
Start: 2019-11-30 — End: 2019-11-30

## 2019-11-30 MED ORDER — HEPARIN SODIUM (PORCINE) 5000 UNIT/ML IJ SOLN
5000.00 | INTRAMUSCULAR | Status: DC
Start: 2019-11-29 — End: 2019-11-30

## 2019-12-01 DIAGNOSIS — K567 Ileus, unspecified: Secondary | ICD-10-CM | POA: Diagnosis not present

## 2019-12-01 DIAGNOSIS — K9189 Other postprocedural complications and disorders of digestive system: Secondary | ICD-10-CM | POA: Diagnosis not present

## 2019-12-01 DIAGNOSIS — K219 Gastro-esophageal reflux disease without esophagitis: Secondary | ICD-10-CM | POA: Diagnosis not present

## 2019-12-01 DIAGNOSIS — I10 Essential (primary) hypertension: Secondary | ICD-10-CM | POA: Diagnosis not present

## 2019-12-04 DIAGNOSIS — I10 Essential (primary) hypertension: Secondary | ICD-10-CM | POA: Diagnosis not present

## 2019-12-04 DIAGNOSIS — K5649 Other impaction of intestine: Secondary | ICD-10-CM | POA: Diagnosis not present

## 2019-12-04 DIAGNOSIS — E568 Deficiency of other vitamins: Secondary | ICD-10-CM | POA: Diagnosis not present

## 2019-12-05 DIAGNOSIS — K219 Gastro-esophageal reflux disease without esophagitis: Secondary | ICD-10-CM | POA: Diagnosis not present

## 2019-12-05 DIAGNOSIS — I9589 Other hypotension: Secondary | ICD-10-CM | POA: Diagnosis not present

## 2019-12-05 DIAGNOSIS — K651 Peritoneal abscess: Secondary | ICD-10-CM | POA: Diagnosis not present

## 2019-12-05 DIAGNOSIS — R638 Other symptoms and signs concerning food and fluid intake: Secondary | ICD-10-CM | POA: Diagnosis not present

## 2019-12-06 DIAGNOSIS — K219 Gastro-esophageal reflux disease without esophagitis: Secondary | ICD-10-CM | POA: Diagnosis not present

## 2019-12-06 DIAGNOSIS — Z79899 Other long term (current) drug therapy: Secondary | ICD-10-CM | POA: Diagnosis not present

## 2019-12-06 DIAGNOSIS — F329 Major depressive disorder, single episode, unspecified: Secondary | ICD-10-CM | POA: Diagnosis not present

## 2019-12-06 DIAGNOSIS — Z885 Allergy status to narcotic agent status: Secondary | ICD-10-CM | POA: Diagnosis not present

## 2019-12-06 DIAGNOSIS — Z87891 Personal history of nicotine dependence: Secondary | ICD-10-CM | POA: Diagnosis not present

## 2019-12-06 DIAGNOSIS — Z9049 Acquired absence of other specified parts of digestive tract: Secondary | ICD-10-CM | POA: Diagnosis not present

## 2019-12-06 DIAGNOSIS — I10 Essential (primary) hypertension: Secondary | ICD-10-CM | POA: Diagnosis not present

## 2019-12-06 DIAGNOSIS — R531 Weakness: Secondary | ICD-10-CM | POA: Diagnosis not present

## 2019-12-06 DIAGNOSIS — R1013 Epigastric pain: Secondary | ICD-10-CM | POA: Diagnosis not present

## 2019-12-06 DIAGNOSIS — Z932 Ileostomy status: Secondary | ICD-10-CM | POA: Diagnosis not present

## 2019-12-07 DIAGNOSIS — I10 Essential (primary) hypertension: Secondary | ICD-10-CM | POA: Diagnosis not present

## 2019-12-07 DIAGNOSIS — E876 Hypokalemia: Secondary | ICD-10-CM | POA: Diagnosis not present

## 2019-12-08 ENCOUNTER — Other Ambulatory Visit: Payer: Self-pay

## 2019-12-08 DIAGNOSIS — I10 Essential (primary) hypertension: Secondary | ICD-10-CM | POA: Diagnosis not present

## 2019-12-08 DIAGNOSIS — E876 Hypokalemia: Secondary | ICD-10-CM | POA: Diagnosis not present

## 2019-12-08 MED ORDER — CHOLECALCIFEROL 25 MCG (1000 UT) PO TABS
1000.00 | ORAL_TABLET | ORAL | Status: DC
Start: 2019-12-09 — End: 2019-12-08

## 2019-12-08 MED ORDER — ATENOLOL 50 MG PO TABS
100.00 | ORAL_TABLET | ORAL | Status: DC
Start: 2019-12-09 — End: 2019-12-08

## 2019-12-08 MED ORDER — SERTRALINE HCL 50 MG PO TABS
50.00 | ORAL_TABLET | ORAL | Status: DC
Start: 2019-12-09 — End: 2019-12-08

## 2019-12-08 MED ORDER — POTASSIUM CHLORIDE CRYS ER 20 MEQ PO TBCR
40.00 | EXTENDED_RELEASE_TABLET | ORAL | Status: DC
Start: 2019-12-08 — End: 2019-12-08

## 2019-12-08 MED ORDER — GENERIC EXTERNAL MEDICATION
2.00 | Status: DC
Start: 2019-12-08 — End: 2019-12-08

## 2019-12-08 MED ORDER — ONDANSETRON HCL 4 MG PO TABS
4.00 | ORAL_TABLET | ORAL | Status: DC
Start: ? — End: 2019-12-08

## 2019-12-08 MED ORDER — COLESEVELAM HCL 625 MG PO TABS
1875.00 | ORAL_TABLET | ORAL | Status: DC
Start: 2019-12-09 — End: 2019-12-08

## 2019-12-08 MED ORDER — TAMSULOSIN HCL 0.4 MG PO CAPS
0.40 | ORAL_CAPSULE | ORAL | Status: DC
Start: 2019-12-09 — End: 2019-12-08

## 2019-12-08 MED ORDER — THERA PO TABS
1.00 | ORAL_TABLET | ORAL | Status: DC
Start: 2019-12-09 — End: 2019-12-08

## 2019-12-08 MED ORDER — SPIRONOLACTONE 25 MG PO TABS
25.00 | ORAL_TABLET | ORAL | Status: DC
Start: 2019-12-09 — End: 2019-12-08

## 2019-12-08 MED ORDER — SODIUM CHLORIDE 0.9 % IV SOLN
10.00 | INTRAVENOUS | Status: DC
Start: ? — End: 2019-12-08

## 2019-12-08 MED ORDER — ENOXAPARIN SODIUM 40 MG/0.4ML ~~LOC~~ SOLN
40.00 | SUBCUTANEOUS | Status: DC
Start: 2019-12-08 — End: 2019-12-08

## 2019-12-08 MED ORDER — GENERIC EXTERNAL MEDICATION
Status: DC
Start: ? — End: 2019-12-08

## 2019-12-08 NOTE — Patient Outreach (Signed)
[facility]) Care Management  12/08/2019  Amery Jara 1951/10/03 [id number]     Transition of Care Referral  Referral Date: 12/08/2019 Referral Source: [facility] Discharge Report Date of Discharge: 12/06/2019 Facility: [place]: [facility]   Referral received. Upon chart review noted that patient sent from facility on 12/06/2019 to hospital due to hypomagnesemia and remains inpatient.    Plan: RN CM will close case at this time.    Enzo Montgomery, RN,BSN,CCM [place] Management Telephonic Care Management Coordinator Direct Phone: [phone] Toll Free: [phone] Fax: [phone]

## 2019-12-09 MED ORDER — GENERIC EXTERNAL MEDICATION
Status: DC
Start: ? — End: 2019-12-09

## 2019-12-09 MED ORDER — PANTOPRAZOLE SODIUM 40 MG PO TBEC
40.00 | DELAYED_RELEASE_TABLET | ORAL | Status: DC
Start: 2019-12-09 — End: 2019-12-09

## 2019-12-09 MED ORDER — POTASSIUM CHLORIDE CRYS ER 20 MEQ PO TBCR
40.00 | EXTENDED_RELEASE_TABLET | ORAL | Status: DC
Start: 2019-12-08 — End: 2019-12-09

## 2019-12-11 DIAGNOSIS — R0902 Hypoxemia: Secondary | ICD-10-CM | POA: Diagnosis not present

## 2019-12-11 DIAGNOSIS — Z9181 History of falling: Secondary | ICD-10-CM | POA: Diagnosis not present

## 2019-12-11 DIAGNOSIS — E568 Deficiency of other vitamins: Secondary | ICD-10-CM | POA: Diagnosis not present

## 2019-12-11 DIAGNOSIS — Z888 Allergy status to other drugs, medicaments and biological substances status: Secondary | ICD-10-CM | POA: Diagnosis not present

## 2019-12-11 DIAGNOSIS — R5381 Other malaise: Secondary | ICD-10-CM | POA: Diagnosis not present

## 2019-12-11 DIAGNOSIS — I10 Essential (primary) hypertension: Secondary | ICD-10-CM | POA: Diagnosis not present

## 2019-12-11 DIAGNOSIS — N179 Acute kidney failure, unspecified: Secondary | ICD-10-CM | POA: Diagnosis not present

## 2019-12-11 DIAGNOSIS — K219 Gastro-esophageal reflux disease without esophagitis: Secondary | ICD-10-CM | POA: Diagnosis not present

## 2019-12-11 DIAGNOSIS — K5649 Other impaction of intestine: Secondary | ICD-10-CM | POA: Diagnosis not present

## 2019-12-11 DIAGNOSIS — K651 Peritoneal abscess: Secondary | ICD-10-CM | POA: Diagnosis not present

## 2019-12-11 DIAGNOSIS — M6281 Muscle weakness (generalized): Secondary | ICD-10-CM | POA: Diagnosis not present

## 2019-12-11 DIAGNOSIS — Z79899 Other long term (current) drug therapy: Secondary | ICD-10-CM | POA: Diagnosis not present

## 2019-12-11 DIAGNOSIS — Z48815 Encounter for surgical aftercare following surgery on the digestive system: Secondary | ICD-10-CM | POA: Diagnosis not present

## 2019-12-11 DIAGNOSIS — Z87891 Personal history of nicotine dependence: Secondary | ICD-10-CM | POA: Diagnosis not present

## 2019-12-11 DIAGNOSIS — R262 Difficulty in walking, not elsewhere classified: Secondary | ICD-10-CM | POA: Diagnosis not present

## 2019-12-11 DIAGNOSIS — Z20828 Contact with and (suspected) exposure to other viral communicable diseases: Secondary | ICD-10-CM | POA: Diagnosis not present

## 2019-12-11 DIAGNOSIS — F329 Major depressive disorder, single episode, unspecified: Secondary | ICD-10-CM | POA: Diagnosis not present

## 2019-12-11 DIAGNOSIS — K567 Ileus, unspecified: Secondary | ICD-10-CM | POA: Diagnosis not present

## 2019-12-11 DIAGNOSIS — E875 Hyperkalemia: Secondary | ICD-10-CM | POA: Diagnosis not present

## 2019-12-11 DIAGNOSIS — Z7189 Other specified counseling: Secondary | ICD-10-CM | POA: Diagnosis not present

## 2019-12-11 DIAGNOSIS — D649 Anemia, unspecified: Secondary | ICD-10-CM | POA: Diagnosis not present

## 2019-12-11 DIAGNOSIS — J069 Acute upper respiratory infection, unspecified: Secondary | ICD-10-CM | POA: Diagnosis not present

## 2019-12-11 DIAGNOSIS — R531 Weakness: Secondary | ICD-10-CM | POA: Diagnosis not present

## 2019-12-13 DIAGNOSIS — I10 Essential (primary) hypertension: Secondary | ICD-10-CM | POA: Diagnosis not present

## 2019-12-13 DIAGNOSIS — K651 Peritoneal abscess: Secondary | ICD-10-CM | POA: Diagnosis not present

## 2019-12-13 DIAGNOSIS — K219 Gastro-esophageal reflux disease without esophagitis: Secondary | ICD-10-CM | POA: Diagnosis not present

## 2019-12-13 DIAGNOSIS — K5649 Other impaction of intestine: Secondary | ICD-10-CM | POA: Diagnosis not present

## 2019-12-13 DIAGNOSIS — Z7189 Other specified counseling: Secondary | ICD-10-CM | POA: Diagnosis not present

## 2019-12-15 DIAGNOSIS — K219 Gastro-esophageal reflux disease without esophagitis: Secondary | ICD-10-CM | POA: Diagnosis not present

## 2019-12-15 DIAGNOSIS — I10 Essential (primary) hypertension: Secondary | ICD-10-CM | POA: Diagnosis not present

## 2019-12-15 DIAGNOSIS — E875 Hyperkalemia: Secondary | ICD-10-CM | POA: Diagnosis not present

## 2019-12-19 DIAGNOSIS — I10 Essential (primary) hypertension: Secondary | ICD-10-CM | POA: Diagnosis not present

## 2019-12-19 DIAGNOSIS — F329 Major depressive disorder, single episode, unspecified: Secondary | ICD-10-CM | POA: Diagnosis not present

## 2019-12-19 DIAGNOSIS — Z888 Allergy status to other drugs, medicaments and biological substances status: Secondary | ICD-10-CM | POA: Diagnosis not present

## 2019-12-19 DIAGNOSIS — Z87891 Personal history of nicotine dependence: Secondary | ICD-10-CM | POA: Diagnosis not present

## 2019-12-19 DIAGNOSIS — Z79899 Other long term (current) drug therapy: Secondary | ICD-10-CM | POA: Diagnosis not present

## 2019-12-19 DIAGNOSIS — R531 Weakness: Secondary | ICD-10-CM | POA: Diagnosis not present

## 2019-12-19 DIAGNOSIS — K219 Gastro-esophageal reflux disease without esophagitis: Secondary | ICD-10-CM | POA: Diagnosis not present

## 2019-12-20 DIAGNOSIS — I10 Essential (primary) hypertension: Secondary | ICD-10-CM | POA: Diagnosis not present

## 2019-12-20 DIAGNOSIS — K219 Gastro-esophageal reflux disease without esophagitis: Secondary | ICD-10-CM | POA: Diagnosis not present

## 2019-12-20 DIAGNOSIS — K651 Peritoneal abscess: Secondary | ICD-10-CM | POA: Diagnosis not present

## 2019-12-22 DIAGNOSIS — E568 Deficiency of other vitamins: Secondary | ICD-10-CM | POA: Diagnosis not present

## 2019-12-22 DIAGNOSIS — K651 Peritoneal abscess: Secondary | ICD-10-CM | POA: Diagnosis not present

## 2019-12-22 DIAGNOSIS — I10 Essential (primary) hypertension: Secondary | ICD-10-CM | POA: Diagnosis not present

## 2019-12-25 DIAGNOSIS — K219 Gastro-esophageal reflux disease without esophagitis: Secondary | ICD-10-CM | POA: Diagnosis not present

## 2019-12-25 DIAGNOSIS — K5649 Other impaction of intestine: Secondary | ICD-10-CM | POA: Diagnosis not present

## 2019-12-25 DIAGNOSIS — E568 Deficiency of other vitamins: Secondary | ICD-10-CM | POA: Diagnosis not present

## 2019-12-25 DIAGNOSIS — I10 Essential (primary) hypertension: Secondary | ICD-10-CM | POA: Diagnosis not present

## 2019-12-26 ENCOUNTER — Other Ambulatory Visit: Payer: Self-pay

## 2019-12-26 NOTE — Patient Outreach (Signed)
[facility]) Care Management  12/26/2019  Ryan Daniel 1951/10/09 [id number]   Referral Date: 12/26/19 Referral Source: [employer] report Referral Reason: Discharge from [facility] on 12-25-19   Outreach Attempt: Telephone call to patient.  He acknowledges being discharged from the facility on yesterday. Patient glad to be home.  He reports some weakness but is able to get around and was able to drive himself yesterday.  Patient reports that he has home health ordered for wound care but not sure of the agency.  He states his sister knows.  He gave CM permission to call sister Ryan Daniel.    Social:Patient lives alone but is normally independent.  He is able to bath and dress self.  He states now he is able to drive some.  He needs help however with shopping and around the house.  He states that his sister Ryan Daniel is involved with his care as well.    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 Patient also with history of HTN, GERD, and recent Hypomagnesemia which required treatment.    Medications: Patient states he has all his medications but he states he will not be taking all of them as he feels he does not need them all. Patient did not review medications on the call.  Advised patient to go over prescribed medications with his PCP when he goes in order take needed medications.  He verbalized understanding.     Appointments:  Patient states he has called that PCP but waiting for a call back to set up appointment.    Update: spoke with sister Ryan Daniel.  She is able to give CM home health contact information.  Lattie Haw with [facility] [phone].    Telephone call to [place] with [facility].  No answer. HIPAA compliant voice message left.    11:55 am: update: Incoming call from [place] with [facility]. She states she is scheduled to see patient on tomorrow and that she spoke with  patient's sister as she handles his scheduling.   Telephone call back to patient to update on home health.  Advised patient of scheduled home health visit on tomorrow.  He states that he also has follow up with his PCP on 01-02-20.    Consent: Discussed [facility] services and support with patient. Patient declines further nurse follow up at this time   Plan: RN CM will close case.    Jone Baseman, RN, MSN [facility] Care Management Care Management Coordinator Direct Line [phone] Toll Free: [phone]  Fax: [phone]

## 2019-12-28 DIAGNOSIS — Z48815 Encounter for surgical aftercare following surgery on the digestive system: Secondary | ICD-10-CM | POA: Diagnosis not present

## 2019-12-28 DIAGNOSIS — I10 Essential (primary) hypertension: Secondary | ICD-10-CM | POA: Diagnosis not present

## 2019-12-28 DIAGNOSIS — N400 Enlarged prostate without lower urinary tract symptoms: Secondary | ICD-10-CM | POA: Diagnosis not present

## 2019-12-28 DIAGNOSIS — E559 Vitamin D deficiency, unspecified: Secondary | ICD-10-CM | POA: Diagnosis not present

## 2019-12-28 DIAGNOSIS — K529 Noninfective gastroenteritis and colitis, unspecified: Secondary | ICD-10-CM | POA: Diagnosis not present

## 2019-12-28 DIAGNOSIS — F329 Major depressive disorder, single episode, unspecified: Secondary | ICD-10-CM | POA: Diagnosis not present

## 2019-12-28 DIAGNOSIS — K219 Gastro-esophageal reflux disease without esophagitis: Secondary | ICD-10-CM | POA: Diagnosis not present

## 2019-12-28 DIAGNOSIS — E538 Deficiency of other specified B group vitamins: Secondary | ICD-10-CM | POA: Diagnosis not present

## 2020-01-01 ENCOUNTER — Telehealth: Payer: Self-pay | Admitting: Family Medicine

## 2020-01-01 NOTE — Telephone Encounter (Signed)
fyi

## 2020-01-01 NOTE — Telephone Encounter (Signed)
Ryan Daniel from  [place] at home. She wanted to inform Dr. Martinique that it may be a delay in seeing the patient because she cannot get in touch with the pt so that she would try again this weekend.

## 2020-01-02 ENCOUNTER — Telehealth: Payer: Self-pay | Admitting: Family Medicine

## 2020-01-02 ENCOUNTER — Inpatient Hospital Stay: Payer: Medicare HMO | Admitting: Family Medicine

## 2020-01-02 NOTE — Telephone Encounter (Signed)
fyi

## 2020-01-02 NOTE — Telephone Encounter (Signed)
Donnie, Passenger transport manager for [facility], called to state that there is a delay in Occupational therapy evaluation for the pt. He called pt last night and pt did not return his call and also dropped by his house no one was home. He will try again next week.   Donnie can be reached at [phone]

## 2020-01-09 ENCOUNTER — Telehealth: Payer: Self-pay | Admitting: Family Medicine

## 2020-01-09 NOTE — Telephone Encounter (Signed)
fyi

## 2020-01-09 NOTE — Telephone Encounter (Signed)
Gani with [facility] calling with an update. Pt has decline home health services and pt has not returned calls for  occupation therapy. Thanks

## 2020-01-30 ENCOUNTER — Other Ambulatory Visit: Payer: Self-pay | Admitting: Family Medicine

## 2020-01-30 DIAGNOSIS — I10 Essential (primary) hypertension: Secondary | ICD-10-CM

## 2020-01-31 ENCOUNTER — Other Ambulatory Visit: Payer: Self-pay

## 2020-01-31 DIAGNOSIS — F3341 Major depressive disorder, recurrent, in partial remission: Secondary | ICD-10-CM

## 2020-01-31 MED ORDER — SERTRALINE HCL 50 MG PO TABS: 50.0000 mg | ORAL_TABLET | Freq: Every day | ORAL | 1 refills | Status: DC

## 2020-02-18 DIAGNOSIS — R4020 Unspecified coma: Secondary | ICD-10-CM | POA: Diagnosis not present

## 2020-02-18 DIAGNOSIS — R404 Transient alteration of awareness: Secondary | ICD-10-CM | POA: Diagnosis not present

## 2020-02-18 DIAGNOSIS — R0902 Hypoxemia: Secondary | ICD-10-CM | POA: Diagnosis not present

## 2020-02-18 DIAGNOSIS — R9431 Abnormal electrocardiogram [ECG] [EKG]: Secondary | ICD-10-CM | POA: Diagnosis not present

## 2020-02-18 DIAGNOSIS — E639 Nutritional deficiency, unspecified: Secondary | ICD-10-CM | POA: Diagnosis not present

## 2020-02-18 DIAGNOSIS — R61 Generalized hyperhidrosis: Secondary | ICD-10-CM | POA: Diagnosis not present

## 2020-02-18 DIAGNOSIS — I10 Essential (primary) hypertension: Secondary | ICD-10-CM | POA: Diagnosis not present

## 2020-02-18 DIAGNOSIS — K279 Peptic ulcer, site unspecified, unspecified as acute or chronic, without hemorrhage or perforation: Secondary | ICD-10-CM | POA: Diagnosis not present

## 2020-02-18 DIAGNOSIS — M47812 Spondylosis without myelopathy or radiculopathy, cervical region: Secondary | ICD-10-CM | POA: Diagnosis not present

## 2020-02-18 DIAGNOSIS — M47892 Other spondylosis, cervical region: Secondary | ICD-10-CM | POA: Diagnosis not present

## 2020-02-18 DIAGNOSIS — R569 Unspecified convulsions: Secondary | ICD-10-CM | POA: Diagnosis not present

## 2020-02-18 DIAGNOSIS — F1010 Alcohol abuse, uncomplicated: Secondary | ICD-10-CM | POA: Diagnosis not present

## 2020-02-18 DIAGNOSIS — G40909 Epilepsy, unspecified, not intractable, without status epilepticus: Secondary | ICD-10-CM | POA: Diagnosis not present

## 2020-02-19 DIAGNOSIS — R569 Unspecified convulsions: Secondary | ICD-10-CM | POA: Diagnosis not present

## 2020-02-27 ENCOUNTER — Inpatient Hospital Stay: Payer: Medicare HMO | Admitting: Family Medicine

## 2020-03-14 DIAGNOSIS — H2513 Age-related nuclear cataract, bilateral: Secondary | ICD-10-CM | POA: Diagnosis not present

## 2020-03-14 DIAGNOSIS — H33001 Unspecified retinal detachment with retinal break, right eye: Secondary | ICD-10-CM | POA: Diagnosis not present

## 2020-03-14 DIAGNOSIS — H47211 Primary optic atrophy, right eye: Secondary | ICD-10-CM | POA: Diagnosis not present

## 2020-03-14 DIAGNOSIS — H524 Presbyopia: Secondary | ICD-10-CM | POA: Diagnosis not present

## 2020-03-18 DIAGNOSIS — H04123 Dry eye syndrome of bilateral lacrimal glands: Secondary | ICD-10-CM | POA: Diagnosis not present

## 2020-03-18 DIAGNOSIS — H35363 Drusen (degenerative) of macula, bilateral: Secondary | ICD-10-CM | POA: Diagnosis not present

## 2020-03-18 DIAGNOSIS — H47011 Ischemic optic neuropathy, right eye: Secondary | ICD-10-CM | POA: Diagnosis not present

## 2020-03-18 DIAGNOSIS — H2513 Age-related nuclear cataract, bilateral: Secondary | ICD-10-CM | POA: Diagnosis not present

## 2020-03-18 LAB — HM DIABETES EYE EXAM

## 2020-04-02 ENCOUNTER — Encounter: Payer: Self-pay | Admitting: Family Medicine

## 2020-04-08 ENCOUNTER — Other Ambulatory Visit: Payer: Self-pay | Admitting: Family Medicine

## 2020-04-08 DIAGNOSIS — I10 Essential (primary) hypertension: Secondary | ICD-10-CM

## 2020-05-01 ENCOUNTER — Other Ambulatory Visit: Payer: Self-pay | Admitting: Family Medicine

## 2020-05-01 DIAGNOSIS — I10 Essential (primary) hypertension: Secondary | ICD-10-CM

## 2020-05-15 ENCOUNTER — Encounter: Payer: Self-pay | Admitting: Family Medicine

## 2020-05-15 ENCOUNTER — Ambulatory Visit (INDEPENDENT_AMBULATORY_CARE_PROVIDER_SITE_OTHER): Payer: Medicare HMO | Admitting: Family Medicine

## 2020-05-15 ENCOUNTER — Other Ambulatory Visit: Payer: Self-pay

## 2020-05-15 VITALS — BP 132/80 | HR 74 | Temp 97.9°F | Resp 16 | Ht 70.0 in | Wt 195.4 lb

## 2020-05-15 DIAGNOSIS — I10 Essential (primary) hypertension: Secondary | ICD-10-CM | POA: Diagnosis not present

## 2020-05-15 DIAGNOSIS — Z125 Encounter for screening for malignant neoplasm of prostate: Secondary | ICD-10-CM

## 2020-05-15 DIAGNOSIS — F3341 Major depressive disorder, recurrent, in partial remission: Secondary | ICD-10-CM | POA: Diagnosis not present

## 2020-05-15 DIAGNOSIS — E782 Mixed hyperlipidemia: Secondary | ICD-10-CM | POA: Diagnosis not present

## 2020-05-15 DIAGNOSIS — Z0000 Encounter for general adult medical examination without abnormal findings: Secondary | ICD-10-CM | POA: Diagnosis not present

## 2020-05-15 NOTE — Patient Instructions (Addendum)
A few things to remember from today's visit:   Routine general medical examination at a health care facility  Hyperlipidemia, mixed - Plan: Basic metabolic panel, Lipid panel  Essential hypertension  Prostate cancer screening - Plan: PSA(Must document that pt has been informed of limitations of PSA testing.)  If you need refills please call your pharmacy. Do not use My Chart to request refills or for acute issues that need immediate attention.    Please be sure medication list is accurate. If a new problem present, please set up appointment sooner than planned today.  A few tips:  -As we age balance is not as good as it was, so there is a higher risks for falls. Please remove small rugs and furniture that is "in your way" and could increase the risk of falls. Stretching exercises may help with fall prevention: Yoga and Tai Chi are some examples. Low impact exercise is better, so you are not very achy the next day.  -Sun screen and avoidance of direct sun light recommended. Caution with dehydration, if working outdoors be sure to drink enough fluids.  - Some medications are not safe as we age, increases the risk of side effects and can potentially interact with other medication you are also taken;  including some of over the counter medications. Be sure to let me know when you start a new medication even if it is a dietary/vitamin supplement.   -Healthy diet low in red meet/animal fat and sugar + regular physical activity is recommended.

## 2020-05-15 NOTE — Progress Notes (Signed)
HPI: Mr. Copeland Neisen is a 69 y.o.male here today for his routine physical examination.  Last CPE: 02/23/17. He was last seen on 11/28/18. He lives alone. His sister lives close by.  Regular exercise 3 or more times per week: Walking "occasionally." Following a healthy diet: Yes. He cooks most of the time.  Chronic medical problems: Depression,HLD,and HTN among some.  Immunization History  Administered Date(s) Administered   Pneumococcal Conjugate-13 02/23/2017   -Hep C screening: 02/16/17 NR  Last colon cancer screening: Overdue.  Last prostate ca screening: Not sure. Nocturia x 2-3, stable for years. BPH on Flomax 0.4 mg.  -No Hx of illicit drug use. He drinks 2 shots of bourbon daily.   -Concerns and/or follow up today:   Since his last visit he has had surgery and has been hospitalized a few times. 10/09/2019 small bowel perforation.He underwent exploratory laparoscopy surgery.Small bowel resection with anastomosis and mesh removed.  Closure of abdominal wall on 10/15/19. 11/24/19: Ileus and AKI. Hypok+ attributed to chronic diarrhea,he is on [employer].  Admitted on 12/06/19 because hypoMg and discharged on 12/08/19. Discharged to SNF ,[facility] . Discharged home on 12/26/19.  ER evaluation on 02/18/20 because seizure episode. He was at the grocery store, witnessed tonic-clonic seizure with urine incontinence. Episode was attributed to hypoMg and HypoCa++.  He is back to drive. Before last hospitalization he noted right eye vision loss. 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  In 02/2020 he had an episodes of syncope at the groceries store , he had a seizure. He weaned off himself of medication. Head CT 02/19/20: 1. No acute intracranial abnormality. MRI would be more sensitive for the detection of a seizure focus. MRI would also be more sensitive for the detection of acute ischemia.  2. Similar scattered small white  matter lesions which are nonspecific but most commonly represent chronic small vessel ischemic changes in this age group.   3. Redemonstrated partially empty sella and cerebellar tonsillar crowding at the foramen magnum which are nonspecific findings but can be seen in the setting of idiopathicintracranial hypertension, as discussed on prior MRI on 06/28/2019.  Depression: He is on Sertraline 50 mg daily. Negative for depressed mood or suicidal thoughts. He has taken medication for years.  HLD: He is on non pharmacologic treatment.  Component     Latest Ref Rng & Units 02/23/2017  HDL Cholesterol     >39.00 mg/dL 35.70 (L)   TG 666 TC 247, LDL: 110.  HypoMg++:He is on Mg Oxide 400 mg daily. Last Mg on 02/19/20 normal at 2.0.  HTN: He is on Atenolol 100 mg daily.   Ref Range & Units 3 mo ago Comments  Sodium 135 - 146 MMOL/L 141        Potassium 3.5 - 5.3 MMOL/L 3.8  NO VISIBLE HEMOLYSIS      Chloride 98 - 110 MMOL/L 108        CO2 23 - 30 MMOL/L 24        BUN 8 - 24 MG/DL 11        Glucose 70 - 99 MG/DL      Review of Systems  Constitutional: Negative for activity change, appetite change, fatigue and fever.  HENT: Negative for dental problem, nosebleeds and sore throat.   Eyes: Negative for pain and redness.  Respiratory: Negative for cough, shortness of breath and wheezing.   Cardiovascular: Negative for chest pain, palpitations and leg swelling.  Gastrointestinal: Negative for abdominal pain, blood in stool, nausea and vomiting.  Endocrine: Negative for polydipsia and polyuria.  Genitourinary: Negative for decreased urine volume, dysuria, genital sores, hematuria and testicular pain.  Musculoskeletal: Negative for arthralgias, back pain, joint swelling and myalgias.  Skin: Negative for color change and rash.  Neurological: Negative for syncope, weakness and headaches.  Hematological: Negative for adenopathy. Does not bruise/bleed easily.  Psychiatric/Behavioral:  Negative for confusion and sleep disturbance. The patient is not nervous/anxious.    Current Outpatient Medications on File Prior to Visit  Medication Sig Dispense Refill   atenolol (TENORMIN) 100 MG tablet TAKE 1 TABLET EVERY DAY (NEED MD APPOINTMENT) 30 tablet 0   magnesium oxide (MAG-OX) 400 MG tablet Take 1 tablet (400 mg total) by mouth 2 (two) times daily. 60 tablet 11   Multiple Vitamin (THERA) TABS Take by mouth.     omeprazole (PRILOSEC) 10 MG capsule Take 10 mg by mouth daily.     sertraline (ZOLOFT) 50 MG tablet Take 1 tablet (50 mg total) by mouth daily. 90 tablet 1   vitamin B-12 (CYANOCOBALAMIN) 1000 MCG tablet Take 1 tablet (1,000 mcg total) by mouth daily.     No current facility-administered medications on file prior to visit.    Past Medical History:  Diagnosis Date   DEPRESSION 02/05/2010   GERD 02/05/2010   HYPERTENSION 02/05/2010   TOBACCO ABUSE 09/15/2010    History reviewed. No pertinent surgical history.  Allergies  Allergen Reactions   Oxycodone Nausea And Vomiting   Amlodipine    Prochlorperazine Edisylate     Family History  Problem Relation Age of Onset   Cancer Maternal Grandmother        colon   Heart disease Paternal Grandfather     Social History   Socioeconomic History   Marital status: Single    Spouse name: Not on file   Number of children: Not on file   Years of education: Not on file   Highest education level: Not on file  Occupational History   Not on file  Tobacco Use   Smoking status: Current Every Day Smoker    Packs/day: 0.50    Types: Cigarettes   Smokeless tobacco: Never Used  Substance and Sexual Activity   Alcohol use: Yes    Alcohol/week: 7.0 standard drinks    Types: 7 Standard drinks or equivalent per week   Drug use: No   Sexual activity: Not on file  Other Topics Concern   Not on file  Social History Narrative   Not on file   Social Determinants of Health   Financial Resource  Strain:    Difficulty of Paying Living Expenses:   Food Insecurity:    Worried About Charity fundraiser in the Last Year:    Arboriculturist in the Last Year:   Transportation Needs:    Film/video editor (Medical):    Lack of Transportation (Non-Medical):   Physical Activity:    Days of Exercise per Week:    Minutes of Exercise per Session:   Stress:    Feeling of Stress :   Social Connections:    Frequency of Communication with Friends and Family:    Frequency of Social Gatherings with Friends and Family:    Attends Religious Services:    Active Member of Clubs or Organizations:    Attends Archivist Meetings:    Marital Status:      Vitals:   05/15/20 0809  BP:  132/80  Pulse: 74  Resp: 16  Temp: 97.9 F (36.6 C)  SpO2: 98%   Body mass index is 28.03 kg/m.  Wt Readings from Last 3 Encounters:  05/15/20 195 lb 6 oz (88.6 kg)  11/28/18 216 lb 6 oz (98.1 kg)  12/20/17 193 lb 9.6 oz (87.8 kg)    Physical Exam Vitals and nursing note reviewed.  Constitutional:      General: He is not in acute distress.    Appearance: He is well-developed.  HENT:     Head: Normocephalic and atraumatic.     Right Ear: Tympanic membrane, ear canal and external ear normal.     Left Ear: Tympanic membrane, ear canal and external ear normal.     Mouth/Throat:     Mouth: Mucous membranes are moist.     Pharynx: Oropharynx is clear.  Eyes:     Extraocular Movements: Extraocular movements intact.     Conjunctiva/sclera: Conjunctivae normal.     Pupils: Pupils are equal, round, and reactive to light.  Neck:     Thyroid: No thyromegaly.     Trachea: No tracheal deviation.  Cardiovascular:     Rate and Rhythm: Normal rate and regular rhythm.     Pulses:          Dorsalis pedis pulses are 2+ on the right side and 2+ on the left side.     Heart sounds: No murmur heard.      Comments: DP and PT pulses present bilateral. Pulmonary:     Effort: Pulmonary  effort is normal. No respiratory distress.     Breath sounds: Normal breath sounds.  Abdominal:     Palpations: Abdomen is soft. There is no hepatomegaly or mass.     Tenderness: There is no abdominal tenderness.  Genitourinary:    Comments: Refused,no concerns. Musculoskeletal:        General: No tenderness.     Cervical back: Normal range of motion.     Comments: No signs of synovitis.  Lymphadenopathy:     Cervical: No cervical adenopathy.     Upper Body:     Right upper body: No supraclavicular adenopathy.     Left upper body: No supraclavicular adenopathy.  Skin:    General: Skin is warm.     Findings: No erythema.  Neurological:     Mental Status: He is alert and oriented to person, place, and time.     Cranial Nerves: No cranial nerve deficit.     Sensory: No sensory deficit.     Coordination: Coordination normal.     Deep Tendon Reflexes:     Reflex Scores:      Bicep reflexes are 2+ on the right side and 2+ on the left side.      Patellar reflexes are 2+ on the right side and 2+ on the left side.    Comments: Mildly unstable gait, not assisted.  Psychiatric:        Mood and Affect: Mood and affect normal.     Comments: Well groomed,. Good eye contact.    ASSESSMENT AND PLAN:  Mr. Cobi was seen today for annual exam.  Diagnoses and all orders for this visit:  Routine general medical examination at a health care facility We discussed the importance of regular physical activity and healthy diet for prevention of chronic illness and/or complications. Preventive guidelines reviewed. Vaccination: Refused pneumovax.  Next CPE in a year.  Hyperlipidemia, mixed He is not on pharmacologic treatment. LDL goal <  100,ideally < 70 due to reported hx of CVA. Further recommendations will be given according to FLP results.  Essential hypertension BP adequately controlled. No changes in current management. Eye exam is current.  Prostate cancer screening -     PSA(Must  document that pt has been informed of limitations of PSA testing.); Future  Depression, major, recurrent, in partial remission ([place]) Problem is stable. Continue Sertraline 50 mg daily.  Hypomagnesemia Continue Mg Oxide 400 mg bid. Further recommendations according ot Mg result.  Return in 22 weeks (on 10/16/2020) for AWV and f/u.    Murdock Jellison G. Martinique, MD  [facility]. [place] office.   A few things to remember from today's visit:   Routine general medical examination at a health care facility  Hyperlipidemia, mixed - Plan: Basic metabolic panel, Lipid panel  Essential hypertension  Prostate cancer screening - Plan: PSA(Must document that pt has been informed of limitations of PSA testing.)  If you need refills please call your pharmacy. Do not use My Chart to request refills or for acute issues that need immediate attention.    Please be sure medication list is accurate. If a new problem present, please set up appointment sooner than planned today.  A few tips:  -As we age balance is not as good as it was, so there is a higher risks for falls. Please remove small rugs and furniture that is "in your way" and could increase the risk of falls. Stretching exercises may help with fall prevention: Yoga and Tai Chi are some examples. Low impact exercise is better, so you are not very achy the next day.  -Sun screen and avoidance of direct sun light recommended. Caution with dehydration, if working outdoors be sure to drink enough fluids.  - Some medications are not safe as we age, increases the risk of side effects and can potentially interact with other medication you are also taken;  including some of over the counter medications. Be sure to let me know when you start a new medication even if it is a dietary/vitamin supplement.   -Healthy diet low in red meet/animal fat and sugar + regular physical activity is recommended.

## 2020-05-19 MED ORDER — ATENOLOL 100 MG PO TABS: ORAL_TABLET | ORAL | 2 refills | Status: DC

## 2020-05-19 MED ORDER — SERTRALINE HCL 50 MG PO TABS: 50.0000 mg | ORAL_TABLET | Freq: Every day | ORAL | 2 refills | Status: DC

## 2020-05-20 ENCOUNTER — Other Ambulatory Visit: Payer: Self-pay

## 2020-05-20 ENCOUNTER — Other Ambulatory Visit (INDEPENDENT_AMBULATORY_CARE_PROVIDER_SITE_OTHER): Payer: Medicare HMO

## 2020-05-20 DIAGNOSIS — Z125 Encounter for screening for malignant neoplasm of prostate: Secondary | ICD-10-CM

## 2020-05-20 DIAGNOSIS — E782 Mixed hyperlipidemia: Secondary | ICD-10-CM | POA: Diagnosis not present

## 2020-05-20 NOTE — Addendum Note (Signed)
Addended by: Marrion Coy on: 05/20/2020 11:43 AM   Modules accepted: Orders

## 2020-05-21 LAB — MAGNESIUM: Magnesium: 1.3 mg/dL — ABNORMAL LOW (ref 1.5–2.5)

## 2020-05-21 LAB — BASIC METABOLIC PANEL
BUN/Creatinine Ratio: 19 (calc) (ref 6–22)
BUN: 25 mg/dL (ref 7–25)
CO2: 27 mmol/L (ref 20–32)
Calcium: 9.2 mg/dL (ref 8.6–10.3)
Chloride: 102 mmol/L (ref 98–110)
Creat: 1.31 mg/dL — ABNORMAL HIGH (ref 0.70–1.25)
Glucose, Bld: 87 mg/dL (ref 65–99)
Potassium: 4.5 mmol/L (ref 3.5–5.3)
Sodium: 138 mmol/L (ref 135–146)

## 2020-05-21 LAB — LIPID PANEL
Cholesterol: 216 mg/dL — ABNORMAL HIGH
HDL: 36 mg/dL — ABNORMAL LOW
LDL Cholesterol (Calc): 135 mg/dL — ABNORMAL HIGH
Non-HDL Cholesterol (Calc): 180 mg/dL — ABNORMAL HIGH
Total CHOL/HDL Ratio: 6 (calc) — ABNORMAL HIGH
Triglycerides: 313 mg/dL — ABNORMAL HIGH

## 2020-05-21 LAB — PSA: PSA: 2.6 ng/mL (ref <=4.0)

## 2020-05-25 ENCOUNTER — Other Ambulatory Visit: Payer: Self-pay | Admitting: Family Medicine

## 2020-05-25 DIAGNOSIS — I10 Essential (primary) hypertension: Secondary | ICD-10-CM

## 2020-06-11 ENCOUNTER — Other Ambulatory Visit: Payer: Self-pay

## 2020-06-11 MED ORDER — SIMVASTATIN 20 MG PO TABS
20.0000 mg | ORAL_TABLET | Freq: Every day | ORAL | 3 refills | Status: DC
Start: 2020-06-11 — End: 2020-11-20

## 2020-08-19 ENCOUNTER — Telehealth: Payer: Self-pay | Admitting: Family Medicine

## 2020-08-19 NOTE — Telephone Encounter (Signed)
Pt call and want a RX for generic Viagra sent to [facility] at [address] [place] [address]

## 2020-08-19 NOTE — Telephone Encounter (Signed)
Okay to send? Pt had appt in 07/21, and has follow up for 12/21.

## 2020-08-20 NOTE — Telephone Encounter (Signed)
Patient called back, is requesting a call once medication has been called in.

## 2020-08-20 NOTE — Telephone Encounter (Signed)
I spoke with pt. Virtual appt set up for tomorrow at 4:30.

## 2020-08-20 NOTE — Telephone Encounter (Signed)
I do not see Viagra on his medication list. Maybe it has been prescribed elsewhere. I do not feel comfortable adding meds. We can discuss this during next visit. Thanks, Ryan Daniel

## 2020-08-21 ENCOUNTER — Telehealth (INDEPENDENT_AMBULATORY_CARE_PROVIDER_SITE_OTHER): Payer: Medicare HMO | Admitting: Family Medicine

## 2020-08-21 ENCOUNTER — Encounter: Payer: Self-pay | Admitting: Family Medicine

## 2020-08-21 VITALS — Ht 70.0 in

## 2020-08-21 DIAGNOSIS — I10 Essential (primary) hypertension: Secondary | ICD-10-CM

## 2020-08-21 DIAGNOSIS — N529 Male erectile dysfunction, unspecified: Secondary | ICD-10-CM

## 2020-08-21 MED ORDER — SILDENAFIL CITRATE 20 MG PO TABS: 20.0000 mg | ORAL_TABLET | Freq: Every day | ORAL | 0 refills | Status: DC | PRN

## 2020-08-21 NOTE — Progress Notes (Signed)
Virtual Visit via Video Note I connected with Ryan Daniel on 08/21/20 by a video enabled telemedicine application and verified that I am speaking with the correct person using two identifiers.  Location patient: home Location provider:work office Persons participating in the virtual visit: patient, provider  I discussed the limitations of evaluation and management by telemedicine and the availability of in person appointments. The patient expressed understanding and agreed to proceed.   HPI: Ryan Daniel is a 69 year old male with history of hypertension, HLD,GERD, and depression who is being seen today to discuss ED treatment. He is requesting Rx for generic Viagra.  This is a new problem that is started about 5 months ago. He usually can have an erection but he has difficulty maintaining it for more than 10 minutes. Started a relationship a few months ago.   He has not noted genital lesions or anatomical deformity. He has not tried OTC medications. + Erections while asleep.  He has not noted dysuria, gross hematuria, difficulty starting urination, or rectal pain. Negative for decreased libido,fatigue,or worsening depression.  No history of CAD. Former smoker. Hypertension: He is on atenolol 100 mg daily. Negative for unusual headache, visual changes, CP, dyspnea, palpitation, abdominal pain, N/V,decreased urine output, or edema.  Lab Results  Component Value Date   CREATININE 1.31 (H) 05/20/2020   BUN 25 05/20/2020   NA 138 05/20/2020   K 4.5 05/20/2020   CL 102 05/20/2020   CO2 27 05/20/2020   ROS: See pertinent positives and negatives per HPI.  Past Medical History:  Diagnosis Date  . DEPRESSION 02/05/2010  . GERD 02/05/2010  . HYPERTENSION 02/05/2010  . TOBACCO ABUSE 09/15/2010   No past surgical history on file.  Family History  Problem Relation Age of Onset  . Cancer Maternal Grandmother        colon  . Heart disease Paternal Grandfather     Social History    Socioeconomic History  . Marital status: Single    Spouse name: Not on file  . Number of children: Not on file  . Years of education: Not on file  . Highest education level: Not on file  Occupational History  . Not on file  Tobacco Use  . Smoking status: Former Smoker    Packs/day: 0.50    Types: Cigarettes    Quit date: 11/01/2018    Years since quitting: 1.8  . Smokeless tobacco: Never Used  Substance and Sexual Activity  . Alcohol use: Yes    Alcohol/week: 7.0 standard drinks    Types: 7 Standard drinks or equivalent per week  . Drug use: No  . Sexual activity: Yes    Partners: Female  Other Topics Concern  . Not on file  Social History Narrative  . Not on file   Social Determinants of Health   Financial Resource Strain:   . Difficulty of Paying Living Expenses: Not on file  Food Insecurity:   . Worried About Charity fundraiser in the Last Year: Not on file  . Ran Out of Food in the Last Year: Not on file  Transportation Needs:   . Lack of Transportation (Medical): Not on file  . Lack of Transportation (Non-Medical): Not on file  Physical Activity:   . Days of Exercise per Week: Not on file  . Minutes of Exercise per Session: Not on file  Stress:   . Feeling of Stress : Not on file  Social Connections:   . Frequency of Communication with Friends and  Family: Not on file  . Frequency of Social Gatherings with Friends and Family: Not on file  . Attends Religious Services: Not on file  . Active Member of Clubs or Organizations: Not on file  . Attends Archivist Meetings: Not on file  . Marital Status: Not on file  Intimate Partner Violence:   . Fear of Current or Ex-Partner: Not on file  . Emotionally Abused: Not on file  . Physically Abused: Not on file  . Sexually Abused: Not on file    Current Outpatient Medications:  .  atenolol (TENORMIN) 100 MG tablet, TAKE 1 TABLET EVERY DAY, Disp: 90 tablet, Rfl: 2 .  magnesium oxide (MAG-OX) 400 MG  tablet, Take 1 tablet (400 mg total) by mouth 2 (two) times daily., Disp: 60 tablet, Rfl: 11 .  Multiple Vitamin (THERA) TABS, Take by mouth., Disp: , Rfl:  .  omeprazole (PRILOSEC) 10 MG capsule, Take 10 mg by mouth daily., Disp: , Rfl:  .  sertraline (ZOLOFT) 50 MG tablet, Take 1 tablet (50 mg total) by mouth daily., Disp: 90 tablet, Rfl: 2 .  sildenafil (REVATIO) 20 MG tablet, Take 1-3 tablets (20-60 mg total) by mouth daily as needed., Disp: 90 tablet, Rfl: 0 .  simvastatin (ZOCOR) 20 MG tablet, Take 1 tablet (20 mg total) by mouth at bedtime., Disp: 90 tablet, Rfl: 3 .  vitamin B-12 (CYANOCOBALAMIN) 1000 MCG tablet, Take 1 tablet (1,000 mcg total) by mouth daily., Disp: , Rfl:   EXAM:  VITALS per patient if applicable:Ht 5\' 10"  (1.778 m)   BMI 28.03 kg/m   GENERAL: alert, oriented, appears well and in no acute distress  HEENT: atraumatic, conjunctiva clear, no obvious abnormalities on inspection.  NECK: normal movements of the head and neck  LUNGS: on inspection no signs of respiratory distress, breathing rate appears normal, no obvious gross SOB, gasping or wheezing  CV: no obvious cyanosis  PSYCH/NEURO: pleasant and cooperative, no obvious depression or anxiety, speech and thought processing grossly intact  ASSESSMENT AND PLAN:  Discussed the following assessment and plan:  Erectile dysfunction, unspecified erectile dysfunction type We discussed possible etiologies and treatment options. Sildenafil 20 mg to take 1-3 tabs daily as needed started today. We discussed mechanism of action and side effects. Instructed about warning signs.  Essential hypertension Continue Atenolol 100 mg daily. Monitor BP regularly. Keep next f/u appt.  I discussed the assessment and treatment plan with the patient. Ryan Daniel was provided an opportunity to ask questions and all were answered. He agreed with the plan and demonstrated an understanding of the instructions.    Return if  symptoms worsen or fail to improve, for Keep next appt..    Thelma Lorenzetti Martinique, MD

## 2020-10-24 ENCOUNTER — Telehealth: Payer: Self-pay | Admitting: Family Medicine

## 2020-10-24 NOTE — Telephone Encounter (Signed)
I spoke with patient to schedule AWV appointment.  He stated he has appointment 11/20/20 for labs.  Appointment was schedule for follow up with Dr Martinique.  Patient wanted to know if he needs appointment with Dr Martinique or Labs and does he need to fast for this appointment.  He would like a response through my chart.  He is out of town right now.

## 2020-11-18 ENCOUNTER — Telehealth: Payer: Self-pay | Admitting: Family Medicine

## 2020-11-18 NOTE — Telephone Encounter (Signed)
Left message asking pt to call office Please change awv appointment to virtual or rescheduled  [place] will be out of office

## 2020-11-20 ENCOUNTER — Encounter: Payer: Self-pay | Admitting: Family Medicine

## 2020-11-20 ENCOUNTER — Ambulatory Visit (INDEPENDENT_AMBULATORY_CARE_PROVIDER_SITE_OTHER): Payer: Medicare HMO | Admitting: Family Medicine

## 2020-11-20 ENCOUNTER — Telehealth: Payer: Medicare HMO

## 2020-11-20 ENCOUNTER — Other Ambulatory Visit: Payer: Self-pay

## 2020-11-20 VITALS — BP 128/70 | HR 71 | Resp 16 | Ht 70.0 in | Wt 208.0 lb

## 2020-11-20 DIAGNOSIS — E782 Mixed hyperlipidemia: Secondary | ICD-10-CM

## 2020-11-20 DIAGNOSIS — I10 Essential (primary) hypertension: Secondary | ICD-10-CM | POA: Diagnosis not present

## 2020-11-20 DIAGNOSIS — F3341 Major depressive disorder, recurrent, in partial remission: Secondary | ICD-10-CM

## 2020-11-20 LAB — COMPREHENSIVE METABOLIC PANEL
ALT: 13 U/L (ref 0–53)
AST: 15 U/L (ref 0–37)
Albumin: 4.3 g/dL (ref 3.5–5.2)
Alkaline Phosphatase: 90 U/L (ref 39–117)
BUN: 22 mg/dL (ref 6–23)
CO2: 32 mEq/L (ref 19–32)
Calcium: 10 mg/dL (ref 8.4–10.5)
Chloride: 97 mEq/L (ref 96–112)
Creatinine, Ser: 1.4 mg/dL (ref 0.40–1.50)
GFR: 51.52 mL/min — ABNORMAL LOW (ref >60.00)
Glucose, Bld: 86 mg/dL (ref 70–99)
Potassium: 4.7 mEq/L (ref 3.5–5.1)
Sodium: 136 mEq/L (ref 135–145)
Total Bilirubin: 0.4 mg/dL (ref 0.2–1.2)
Total Protein: 7.8 g/dL (ref 6.0–8.3)

## 2020-11-20 LAB — LIPID PANEL
Cholesterol: 268 mg/dL — ABNORMAL HIGH (ref 0–200)
HDL: 38.2 mg/dL — ABNORMAL LOW (ref >39.00)
Total CHOL/HDL Ratio: 7
Triglycerides: 502 mg/dL — ABNORMAL HIGH (ref 0.0–149.0)

## 2020-11-20 LAB — LDL CHOLESTEROL, DIRECT: Direct LDL: 144 mg/dL

## 2020-11-20 LAB — MAGNESIUM: Magnesium: 1.6 mg/dL (ref 1.5–2.5)

## 2020-11-20 MED ORDER — MAGNESIUM OXIDE 400 MG PO TABS: 400.0000 mg | ORAL_TABLET | Freq: Every day | ORAL | 0 refills | Status: DC

## 2020-11-20 NOTE — Assessment & Plan Note (Signed)
Continue Mg Oxide 400 mg daily, we will adjust dose according to Mg levels.

## 2020-11-20 NOTE — Assessment & Plan Note (Signed)
BP adequately controlled. Continue Atenolol 100 mg daily. Low salt diet.

## 2020-11-20 NOTE — Patient Instructions (Addendum)
A few things to remember from today's visit:   Hyperlipidemia, mixed - Plan: Lipid panel  Essential hypertension - Plan: CMP  Hypomagnesemia - Plan: Magnesium  Depression, major, recurrent, in partial remission ([place])  If you need refills please call your pharmacy. Do not use My Chart to request refills or for acute issues that need immediate attention.   No changes today. Decreased alcohol intake.  Please be sure medication list is accurate. If a new problem present, please set up appointment sooner than planned today.

## 2020-11-20 NOTE — Assessment & Plan Note (Addendum)
Did not tolerate statin well, so for now he will continue non phar,acologic treatment. Further recommendations according to [place]. Recommend decreasing alcohol intake.

## 2020-11-20 NOTE — Progress Notes (Signed)
HPI: [name] is a 70 y.o. male, who is here today for follow up.   He was last seen on 08/21/20. No new problems since his last visit. HLD: Simvastatin 20 mg started last visit, discontinued because it caused nausea. He has made some dietary changes. No fried food, whole wheat bread, increase nut intake.  Lab Results  Component Value Date   CHOL 216 (H) 05/20/2020   HDL 36 (L) 05/20/2020   LDLCALC 135 (H) 05/20/2020   LDLDIRECT 110.0 02/23/2017   TRIG 313 (H) 05/20/2020   CHOLHDL 6.0 (H) 05/20/2020   Former smoker, quit 25 years ago. He drinks bourbon 1-2 drinks daily.  HypoMg++: He is on Mg Oxide 400 mg daily. Mg was low at 1.3 on 05/20/20.  HTN: He is on Atenolol 100 mg daily. Negative for severe/frequent headache, visual changes, chest pain, dyspnea, palpitation, claudication, focal weakness, or edema.  Component     Latest Ref Rng & Units 05/20/2020  Glucose     70 - 99 mg/dL 87  BUN     6 - 23 mg/dL 25  Creatinine     0.40 - 1.50 mg/dL 1.31 (H)  BUN/Creatinine Ratio     6 - 22 (calc) 19  Sodium     135 - 145 mEq/L 138  Potassium     3.5 - 5.1 mEq/L 4.5  Chloride     96 - 112 mEq/L 102  CO2     19 - 32 mEq/L 27  Calcium     8.4 - 10.5 mg/dL 9.2   Depression: He is on Sertraline 50 mg daily. He has been on same medication for years.  Depression screen [facility] 2/9 11/20/2020  Decreased Interest 0  Down, Depressed, Hopeless 0  PHQ - 2 Score 0  Altered sleeping 0  Tired, decreased energy 0  Change in appetite 0  Feeling bad or failure about yourself  0  Trouble concentrating 0  Moving slowly or fidgety/restless 0  Suicidal thoughts 0  PHQ-9 Score 0  Difficult doing work/chores Not difficult at all   GAD 7 : Generalized Anxiety Score 11/20/2020  Nervous, Anxious, on Edge 0  Control/stop worrying 0  Worry too much - different things 0  Trouble relaxing 0  Restless 0  Easily annoyed or irritable 0  Afraid - awful might happen 0  Total GAD 7  Score 0  Anxiety Difficulty Not difficult at all    Review of Systems  Constitutional: Negative for activity change, appetite change, fatigue and fever.  HENT: Negative for mouth sores, nosebleeds and sore throat.   Eyes: Negative for redness and visual disturbance.  Respiratory: Negative for cough and wheezing.   Gastrointestinal: Negative for abdominal pain, nausea and vomiting.  Genitourinary: Negative for decreased urine volume, dysuria and hematuria.  Neurological: Negative for syncope, facial asymmetry and weakness.  Rest of ROS, see pertinent positives sand negatives in HPI  Current Outpatient Medications on File Prior to Visit  Medication Sig Dispense Refill  . atenolol (TENORMIN) 100 MG tablet TAKE 1 TABLET EVERY DAY 90 tablet 2  . Multiple Vitamin (THERA) TABS Take by mouth.    Marland Kitchen omeprazole (PRILOSEC) 10 MG capsule Take 10 mg by mouth daily.    . sertraline (ZOLOFT) 50 MG tablet Take 1 tablet (50 mg total) by mouth daily. 90 tablet 2  . sildenafil (REVATIO) 20 MG tablet Take 1-3 tablets (20-60 mg total) by mouth daily as needed. 90 tablet 0  . vitamin B-12 (  CYANOCOBALAMIN) 1000 MCG tablet Take 1 tablet (1,000 mcg total) by mouth daily.     No current facility-administered medications on file prior to visit.     Past Medical History:  Diagnosis Date  . DEPRESSION 02/05/2010  . GERD 02/05/2010  . HYPERTENSION 02/05/2010  . TOBACCO ABUSE 09/15/2010   Allergies  Allergen Reactions  . Oxycodone Nausea And Vomiting  . Amlodipine   . Prochlorperazine Edisylate     Social History   Socioeconomic History  . Marital status: Single    Spouse name: Not on file  . Number of children: Not on file  . Years of education: Not on file  . Highest education level: Not on file  Occupational History  . Not on file  Tobacco Use  . Smoking status: Former Smoker    Packs/day: 0.50    Types: Cigarettes    Quit date: 11/01/2018    Years since quitting: 2.0  . Smokeless tobacco:  Never Used  Substance and Sexual Activity  . Alcohol use: Yes    Alcohol/week: 7.0 standard drinks    Types: 7 Standard drinks or equivalent per week  . Drug use: No  . Sexual activity: Yes    Partners: Female  Other Topics Concern  . Not on file  Social History Narrative  . Not on file   Social Determinants of Health   Financial Resource Strain: Not on file  Food Insecurity: Not on file  Transportation Needs: Not on file  Physical Activity: Not on file  Stress: Not on file  Social Connections: Not on file   Vitals:   11/20/20 0814  BP: 128/70  Pulse: 71  Resp: 16  SpO2: 95%   Wt Readings from Last 3 Encounters:  11/20/20 208 lb (94.3 kg)  05/15/20 195 lb 6 oz (88.6 kg)  11/28/18 216 lb 6 oz (98.1 kg)   Body mass index is 29.84 kg/m.  Physical Exam Nursing note reviewed.  Constitutional:      General: He is not in acute distress.    Appearance: He is well-developed.  HENT:     Head: Normocephalic and atraumatic.     Mouth/Throat:     Mouth: Oropharynx is clear and moist and mucous membranes are normal. Mucous membranes are moist.     Pharynx: Oropharynx is clear.  Eyes:     Conjunctiva/sclera: Conjunctivae normal.  Cardiovascular:     Rate and Rhythm: Normal rate and regular rhythm.     Pulses:          Posterior tibial pulses are 2+ on the right side and 2+ on the left side.     Heart sounds: No murmur heard.   Pulmonary:     Effort: Pulmonary effort is normal. No respiratory distress.     Breath sounds: Normal breath sounds.  Abdominal:     Palpations: Abdomen is soft. There is no hepatomegaly or mass.     Tenderness: There is no abdominal tenderness.  Musculoskeletal:        General: No edema.  Lymphadenopathy:     Cervical: No cervical adenopathy.  Skin:    General: Skin is warm.     Findings: No erythema or rash.  Neurological:     Mental Status: He is alert and oriented to person, place, and time.     Cranial Nerves: No cranial nerve  deficit.     Motor: No pronator drift.     Gait: Gait normal.     Deep Tendon Reflexes: Strength  normal.  Psychiatric:        Mood and Affect: Mood and affect normal.        Cognition and Memory: Cognition and memory normal.     Comments: Well groomed, good eye contact.    ASSESSMENT AND PLAN:  [name] was seen today for follow-up.  Orders Placed This Encounter  Procedures  . Lipid panel  . CMP  . Magnesium  . LDL cholesterol, direct   Lab Results  Component Value Date   CREATININE 1.40 11/20/2020   BUN 22 11/20/2020   NA 136 11/20/2020   K 4.7 11/20/2020   CL 97 11/20/2020   CO2 32 11/20/2020   Lab Results  Component Value Date   ALT 13 11/20/2020   AST 15 11/20/2020   ALKPHOS 90 11/20/2020   BILITOT 0.4 11/20/2020   Lab Results  Component Value Date   CHOL 268 (H) 11/20/2020   HDL 38.20 (L) 11/20/2020   LDLCALC 135 (H) 05/20/2020   LDLDIRECT 144.0 11/20/2020   TRIG (H) 11/20/2020    502.0 Triglyceride is over 400; calculations on Lipids are invalid.   CHOLHDL 7 11/20/2020   The 10-year ASCVD risk score Mikey Bussing DC Brooke Bonito., et al., 2013) is: 24.3%   Values used to calculate the score:     Age: 13 years     Sex: Male     Is Non-Hispanic African American: No     Diabetic: No     Tobacco smoker: No     Systolic Blood Pressure: 295 mmHg     Is BP treated: Yes     HDL Cholesterol: 38.2 mg/dL     Total Cholesterol: 268 mg/dL   Estimated Creatinine Clearance: 58.2 mL/min (by C-G formula based on SCr of 1.4 mg/dL).  Hypomagnesemia Continue Mg Oxide 400 mg daily, we will adjust dose according to Mg levels.  Hyperlipidemia, mixed Did not tolerate statin well, so for now he will continue non phar,acologic treatment. Further recommendations according to [place]. Recommend decreasing alcohol intake.  Essential hypertension BP adequately controlled. Continue Atenolol 100 mg daily. Low salt diet.  Depression, major, recurrent, in partial remission  ([place]) Problem seems to be well controlled. Continue Sertraline 50 mg daily.   Return in about 6 months (around 05/20/2021) for cpe.  Ryan Daniel G. [place], MD  [facility]. [place] office.  A few things to remember from today's visit:   Hyperlipidemia, mixed - Plan: Lipid panel  Essential hypertension - Plan: CMP  Hypomagnesemia - Plan: Magnesium  Depression, major, recurrent, in partial remission ([place])  If you need refills please call your pharmacy. Do not use My Chart to request refills or for acute issues that need immediate attention.   No changes today. Decreased alcohol intake.  Please be sure medication list is accurate. If a new problem present, please set up appointment sooner than planned today.

## 2020-11-20 NOTE — Assessment & Plan Note (Signed)
Problem seems to be well controlled. Continue Sertraline 50 mg daily.

## 2020-11-21 MED ORDER — MAGNESIUM OXIDE 400 MG PO TABS: 400.0000 mg | ORAL_TABLET | Freq: Two times a day (BID) | ORAL | 1 refills | Status: DC

## 2020-11-21 NOTE — Addendum Note (Signed)
Addended by: Agnes Lawrence on: 11/21/2020 09:21 AM   Modules accepted: Orders

## 2020-11-22 ENCOUNTER — Telehealth (INDEPENDENT_AMBULATORY_CARE_PROVIDER_SITE_OTHER): Payer: Medicare HMO

## 2020-11-22 DIAGNOSIS — Z0000 Encounter for general adult medical examination without abnormal findings: Secondary | ICD-10-CM | POA: Diagnosis not present

## 2020-11-22 NOTE — Patient Instructions (Signed)
Ryan Daniel , Thank you for taking time to come for your Medicare Wellness Visit. I appreciate your ongoing commitment to your health goals. Please review the following plan we discussed and let me know if I can assist you in the future.   Screening recommendations/referrals: Colonoscopy: Up to date, next due 11/30/2027 Recommended yearly ophthalmology/optometry visit for glaucoma screening and checkup Recommended yearly dental visit for hygiene and checkup  Vaccinations: Influenza vaccine: patient declined  Pneumococcal vaccine: Currently due, you may receive at your next in person office visit  Tdap vaccine: currently due, if you wish to receive you may contact your insurance to discuss any out of pocket cost or you may await and injury to receive  Shingles vaccine: Currently due for Shingrix, if you wish to receive we recommend that you do so at your local pharmacy as it is less expensive     Advanced directives: Advance directive discussed with you today. Even though you declined this today please call our office should you change your mind and we can give you the proper paperwork for you to fill out.   Conditions/risks identified: Please try to incorporate some physical activity into your daily routine   Next appointment: None   Preventive Care 65 Years and Older, Male Preventive care refers to lifestyle choices and visits with your health care provider that can promote health and wellness. What does preventive care include?  A yearly physical exam. This is also called an annual well check.  Dental exams once or twice a year.  Routine eye exams. Ask your health care provider how often you should have your eyes checked.  Personal lifestyle choices, including:  Daily care of your teeth and gums.  Regular physical activity.  Eating a healthy diet.  Avoiding tobacco and drug use.  Limiting alcohol use.  Practicing safe sex.  Taking low doses of aspirin every  day.  Taking vitamin and mineral supplements as recommended by your health care provider. What happens during an annual well check? The services and screenings done by your health care provider during your annual well check will depend on your age, overall health, lifestyle risk factors, and family history of disease. Counseling  Your health care provider may ask you questions about your:  Alcohol use.  Tobacco use.  Drug use.  Emotional well-being.  Home and relationship well-being.  Sexual activity.  Eating habits.  History of falls.  Memory and ability to understand (cognition).  Work and work Statistician. Screening  You may have the following tests or measurements:  Height, weight, and BMI.  Blood pressure.  Lipid and cholesterol levels. These may be checked every 5 years, or more frequently if you are over 32 years old.  Skin check.  Lung cancer screening. You may have this screening every year starting at age 84 if you have a 30-pack-year history of smoking and currently smoke or have quit within the past 15 years.  Fecal occult blood test (FOBT) of the stool. You may have this test every year starting at age 78.  Flexible sigmoidoscopy or colonoscopy. You may have a sigmoidoscopy every 5 years or a colonoscopy every 10 years starting at age 61.  Prostate cancer screening. Recommendations will vary depending on your family history and other risks.  Hepatitis C blood test.  Hepatitis B blood test.  Sexually transmitted disease (STD) testing.  Diabetes screening. This is done by checking your blood sugar (glucose) after you have not eaten for a while (fasting). You may  have this done every 1-3 years.  Abdominal aortic aneurysm (AAA) screening. You may need this if you are a current or former smoker.  Osteoporosis. You may be screened starting at age 61 if you are at high risk. Talk with your health care provider about your test results, treatment options,  and if necessary, the need for more tests. Vaccines  Your health care provider may recommend certain vaccines, such as:  Influenza vaccine. This is recommended every year.  Tetanus, diphtheria, and acellular pertussis (Tdap, Td) vaccine. You may need a Td booster every 10 years.  Zoster vaccine. You may need this after age 25.  Pneumococcal 13-valent conjugate (PCV13) vaccine. One dose is recommended after age 42.  Pneumococcal polysaccharide (PPSV23) vaccine. One dose is recommended after age 87. Talk to your health care provider about which screenings and vaccines you need and how often you need them. This information is not intended to replace advice given to you by your health care provider. Make sure you discuss any questions you have with your health care provider. Document Released: 11/22/2015 Document Revised: 07/15/2016 Document Reviewed: 08/27/2015 Elsevier Interactive Patient Education  2017 [place] Prevention in the Home Falls can cause injuries. They can happen to people of all ages. There are many things you can do to make your home safe and to help prevent falls. What can I do on the outside of my home?  Regularly fix the edges of walkways and driveways and fix any cracks.  Remove anything that might make you trip as you walk through a door, such as a raised step or threshold.  Trim any bushes or trees on the path to your home.  Use bright outdoor lighting.  Clear any walking paths of anything that might make someone trip, such as rocks or tools.  Regularly check to see if handrails are loose or broken. Make sure that both sides of any steps have handrails.  Any raised decks and porches should have guardrails on the edges.  Have any leaves, snow, or ice cleared regularly.  Use sand or salt on walking paths during winter.  Clean up any spills in your garage right away. This includes oil or grease spills. What can I do in the bathroom?  Use night  lights.  Install grab bars by the toilet and in the tub and shower. Do not use towel bars as grab bars.  Use non-skid mats or decals in the tub or shower.  If you need to sit down in the shower, use a plastic, non-slip stool.  Keep the floor dry. Clean up any water that spills on the floor as soon as it happens.  Remove soap buildup in the tub or shower regularly.  Attach bath mats securely with double-sided non-slip rug tape.  Do not have throw rugs and other things on the floor that can make you trip. What can I do in the bedroom?  Use night lights.  Make sure that you have a light by your bed that is easy to reach.  Do not use any sheets or blankets that are too big for your bed. They should not hang down onto the floor.  Have a firm chair that has side arms. You can use this for support while you get dressed.  Do not have throw rugs and other things on the floor that can make you trip. What can I do in the kitchen?  Clean up any spills right away.  Avoid walking on wet  floors.  Keep items that you use a lot in easy-to-reach places.  If you need to reach something above you, use a strong step stool that has a grab bar.  Keep electrical cords out of the way.  Do not use floor polish or wax that makes floors slippery. If you must use wax, use non-skid floor wax.  Do not have throw rugs and other things on the floor that can make you trip. What can I do with my stairs?  Do not leave any items on the stairs.  Make sure that there are handrails on both sides of the stairs and use them. Fix handrails that are broken or loose. Make sure that handrails are as long as the stairways.  Check any carpeting to make sure that it is firmly attached to the stairs. Fix any carpet that is loose or worn.  Avoid having throw rugs at the top or bottom of the stairs. If you do have throw rugs, attach them to the floor with carpet tape.  Make sure that you have a light switch at the  top of the stairs and the bottom of the stairs. If you do not have them, ask someone to add them for you. What else can I do to help prevent falls?  Wear shoes that:  Do not have high heels.  Have rubber bottoms.  Are comfortable and fit you well.  Are closed at the toe. Do not wear sandals.  If you use a stepladder:  Make sure that it is fully opened. Do not climb a closed stepladder.  Make sure that both sides of the stepladder are locked into place.  Ask someone to hold it for you, if possible.  Clearly mark and make sure that you can see:  Any grab bars or handrails.  First and last steps.  Where the edge of each step is.  Use tools that help you move around (mobility aids) if they are needed. These include:  Canes.  Walkers.  Scooters.  Crutches.  Turn on the lights when you go into a dark area. Replace any light bulbs as soon as they burn out.  Set up your furniture so you have a clear path. Avoid moving your furniture around.  If any of your floors are uneven, fix them.  If there are any pets around you, be aware of where they are.  Review your medicines with your doctor. Some medicines can make you feel dizzy. This can increase your chance of falling. Ask your doctor what other things that you can do to help prevent falls. This information is not intended to replace advice given to you by your health care provider. Make sure you discuss any questions you have with your health care provider. Document Released: 08/22/2009 Document Revised: 04/02/2016 Document Reviewed: 11/30/2014 Elsevier Interactive Patient Education  2017 [employer].

## 2020-11-22 NOTE — Progress Notes (Signed)
Subjective:   Ryan Daniel is a 70 y.o. male who presents for an Initial Medicare Annual Wellness Visit.  Virtual Visit via Video Note  I connected with Anhad Shuttleworth on 11/22/20 at 10:30 AM EST by a video enabled telemedicine application and verified that I am speaking with the correct person using two identifiers.  Location: Patient: Home  Provider: office    I discussed the limitations of evaluation and management by telemedicine and the availability of in person appointments. The patient expressed understanding and agreed to proceed.   Ofilia Neas, LPN    Review of Systems    N/A  Cardiac Risk Factors include: advanced age (>24men, >59 women);hypertension;dyslipidemia;male gender     Objective:    Today's Vitals   There is no height or weight on file to calculate BMI.  Advanced Directives 11/22/2020 12/26/2019  Does Patient Have a Medical Advance Directive? No No  Would patient like information on creating a medical advance directive? No - Patient declined -    Current Medications (verified) Outpatient Encounter Medications as of 11/22/2020  Medication Sig  . atenolol (TENORMIN) 100 MG tablet TAKE 1 TABLET EVERY DAY  . magnesium oxide (MAG-OX) 400 MG tablet Take 1 tablet (400 mg total) by mouth 2 (two) times daily.  . Multiple Vitamin (THERA) TABS Take by mouth.  Marland Kitchen omeprazole (PRILOSEC) 10 MG capsule Take 10 mg by mouth daily.  . sertraline (ZOLOFT) 50 MG tablet Take 1 tablet (50 mg total) by mouth daily.  . sildenafil (REVATIO) 20 MG tablet Take 1-3 tablets (20-60 mg total) by mouth daily as needed.  . vitamin B-12 (CYANOCOBALAMIN) 1000 MCG tablet Take 1 tablet (1,000 mcg total) by mouth daily.   No facility-administered encounter medications on file as of 11/22/2020.    Allergies (verified) Oxycodone, Amlodipine, and Prochlorperazine edisylate   History: Past Medical History:  Diagnosis Date  . DEPRESSION 02/05/2010  . GERD 02/05/2010  . HYPERTENSION  02/05/2010  . TOBACCO ABUSE 09/15/2010   History reviewed. No pertinent surgical history. Family History  Problem Relation Age of Onset  . Cancer Maternal Grandmother        colon  . Heart disease Paternal Grandfather    Social History   Socioeconomic History  . Marital status: Single    Spouse name: Not on file  . Number of children: Not on file  . Years of education: Not on file  . Highest education level: Not on file  Occupational History  . Not on file  Tobacco Use  . Smoking status: Former Smoker    Packs/day: 0.50    Types: Cigarettes    Quit date: 11/01/2018    Years since quitting: 2.0  . Smokeless tobacco: Never Used  Substance and Sexual Activity  . Alcohol use: Yes    Alcohol/week: 7.0 standard drinks    Types: 7 Standard drinks or equivalent per week  . Drug use: No  . Sexual activity: Yes    Partners: Female  Other Topics Concern  . Not on file  Social History Narrative  . Not on file   Social Determinants of Health   Financial Resource Strain: Low Risk   . Difficulty of Paying Living Expenses: Not hard at all  Food Insecurity: No Food Insecurity  . Worried About Charity fundraiser in the Last Year: Never true  . Ran Out of Food in the Last Year: Never true  Transportation Needs: No Transportation Needs  . Lack of Transportation (Medical): No  .  Lack of Transportation (Non-Medical): No  Physical Activity: Inactive  . Days of Exercise per Week: 0 days  . Minutes of Exercise per Session: 0 min  Stress: No Stress Concern Present  . Feeling of Stress : Not at all  Social Connections: Socially Isolated  . Frequency of Communication with Friends and Family: More than three times a week  . Frequency of Social Gatherings with Friends and Family: Twice a week  . Attends Religious Services: Never  . Active Member of Clubs or Organizations: No  . Attends Archivist Meetings: Never  . Marital Status: Divorced    Tobacco Counseling Counseling  given: Not Answered   Clinical Intake:  Pre-visit preparation completed: Yes  Pain : No/denies pain     Nutritional Risks: None Diabetes: No  How often do you need to have someone help you when you read instructions, pamphlets, or other written materials from your doctor or pharmacy?: 1 - Never  Diabetic?No      Information entered by :: [place] of Daily Living In your present state of health, do you have any difficulty performing the following activities: 11/22/2020 05/15/2020  Hearing? N N  Vision? 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 N N  Walking or climbing stairs? Y N  Dressing or bathing? N N  Doing errands, shopping? N N  Preparing Food and eating ? N -  Using the Toilet? N -  In the past six months, have you accidently leaked urine? N -  Do you have problems with loss of bowel control? N -  Managing your Medications? N -  Managing your Finances? N -  Housekeeping or managing your Housekeeping? N -  Some recent data might be hidden    Patient Care Team: [place], Betty G, MD as PCP - General (Family Medicine)  Indicate any recent Medical Services you may have received from other than [facility] providers in the past year (date may be approximate).     Assessment:   This is a routine wellness examination for [facility].  Hearing/Vision screen  Hearing Screening   125Hz  250Hz  500Hz  1000Hz  2000Hz  3000Hz  4000Hz  6000Hz  8000Hz   Right ear:           Left ear:           Vision Screening Comments: Patient states gets eyes checked once per year.  Dietary issues and exercise activities discussed:    Goals    . Exercise 3x per week (30 min per time)    . Patient Stated     I would like to move out of my apartment!      Depression Screen PHQ 2/9 Scores 11/22/2020 11/20/2020  PHQ - 2 Score 0 0  PHQ- 9 Score 0 0    Fall Risk Fall Risk  11/22/2020  Falls in the past year? 1  Number falls in past yr: 0   Injury with Fall? 1  Comment tripped on speed bump, broke nose  Risk for fall due to : History of fall(s);Impaired vision  Follow up Falls evaluation completed;Falls prevention discussed    FALL RISK PREVENTION PERTAINING TO THE HOME:  Any stairs in or around the home? No  If so, are there any without handrails? No  Home free of loose throw rugs in walkways, pet beds, electrical cords, etc? Yes  Adequate lighting in your home to reduce risk of falls? Yes   ASSISTIVE DEVICES UTILIZED TO  PREVENT FALLS:  Life alert? No  Use of a cane, walker or w/c? No  Grab bars in the bathroom? No  Shower chair or bench in shower? No  Elevated toilet seat or a handicapped toilet? No    Cognitive Function:   Normal cognitive status assessed by direct observation by this Nurse Health Advisor. No abnormalities found.        Immunizations Immunization History  Administered Date(s) Administered  . Pneumococcal Conjugate-13 02/23/2017    TDAP status: Due, Education has been provided regarding the importance of this vaccine. Advised may receive this vaccine at local pharmacy or Health Dept. Aware to provide a copy of the vaccination record if obtained from local pharmacy or Health Dept. Verbalized acceptance and understanding.  Flu Vaccine status: Declined, Education has been provided regarding the importance of this vaccine but patient still declined. Advised may receive this vaccine at local pharmacy or Health Dept. Aware to provide a copy of the vaccination record if obtained from local pharmacy or Health Dept. Verbalized acceptance and understanding.  Pneumococcal vaccine status: Due, Education has been provided regarding the importance of this vaccine. Advised may receive this vaccine at local pharmacy or Health Dept. Aware to provide a copy of the vaccination record if obtained from local pharmacy or Health Dept. Verbalized acceptance and understanding.  Covid-19 vaccine status: Declined,  Education has been provided regarding the importance of this vaccine but patient still declined. Advised may receive this vaccine at local pharmacy or Health Dept.or vaccine clinic. Aware to provide a copy of the vaccination record if obtained from local pharmacy or Health Dept. Verbalized acceptance and understanding.  Qualifies for Shingles Vaccine? Yes   Zostavax completed No   Shingrix Completed?: No.    Education has been provided regarding the importance of this vaccine. Patient has been advised to call insurance company to determine out of pocket expense if they have not yet received this vaccine. Advised may also receive vaccine at local pharmacy or Health Dept. Verbalized acceptance and understanding.  Screening Tests Health Maintenance  Topic Date Due  . COVID-19 Vaccine (1) 12/06/2020 (Originally 01/17/1956)  . INFLUENZA VACCINE  02/06/2021 (Originally 06/09/2020)  . COLONOSCOPY (Pts 45-45yrs Insurance coverage will need to be confirmed)  11/30/2027  . Hepatitis C Screening  Completed  . TETANUS/TDAP  Discontinued  . PNA vac Low Risk Adult  Discontinued    Health Maintenance  There are no preventive care reminders to display for this patient.  Colorectal cancer screening: Type of screening: Colonoscopy. Completed 0121/2019. Repeat every 10 years  Lung Cancer Screening: (Low Dose CT Chest recommended if Age 29-80 years, 30 pack-year currently smoking OR have quit w/in 15years.) does not qualify.   Lung Cancer Screening Referral: N/A   Additional Screening:  Hepatitis C Screening: does qualify; Completed 02/23/2017   Vision Screening: Recommended annual ophthalmology exams for early detection of glaucoma and other disorders of the eye. Is the patient up to date with their annual eye exam?  Yes  Who is the provider or what is the name of the office in which the patient attends annual eye exams? Dr.Campbell in [place]  If pt is not established with a provider, would they  like to be referred to a provider to establish care? No .   Dental Screening: Recommended annual dental exams for proper oral hygiene  Community Resource Referral / Chronic Care Management: CRR required this visit?  No   CCM required this visit?  No  Plan:     I have personally reviewed and noted the following in the patient's chart:   . Medical and social history . Use of alcohol, tobacco or illicit drugs  . Current medications and supplements . Functional ability and status . Nutritional status . Physical activity . Advanced directives . List of other physicians . Hospitalizations, surgeries, and ER visits in previous 12 months . Vitals . Screenings to include cognitive, depression, and falls . Referrals and appointments  In addition, I have reviewed and discussed with patient certain preventive protocols, quality metrics, and best practice recommendations. A written personalized care plan for preventive services as well as general preventive health recommendations were provided to patient.     Ofilia Neas, LPN   [id number]   Nurse Notes: None

## 2020-12-03 ENCOUNTER — Other Ambulatory Visit: Payer: Self-pay

## 2020-12-03 MED ORDER — FENOFIBRATE 145 MG PO TABS
145.0000 mg | ORAL_TABLET | Freq: Every day | ORAL | 3 refills | Status: DC
Start: 2020-12-03 — End: 2021-09-24

## 2021-02-02 ENCOUNTER — Other Ambulatory Visit: Payer: Self-pay | Admitting: Family Medicine

## 2021-02-02 DIAGNOSIS — F3341 Major depressive disorder, recurrent, in partial remission: Secondary | ICD-10-CM

## 2021-05-07 ENCOUNTER — Other Ambulatory Visit: Payer: Self-pay | Admitting: Family Medicine

## 2021-05-07 DIAGNOSIS — I10 Essential (primary) hypertension: Secondary | ICD-10-CM

## 2021-06-24 ENCOUNTER — Other Ambulatory Visit: Payer: Self-pay | Admitting: Family Medicine

## 2021-06-24 DIAGNOSIS — I10 Essential (primary) hypertension: Secondary | ICD-10-CM

## 2021-09-24 ENCOUNTER — Other Ambulatory Visit: Payer: Self-pay | Admitting: Family Medicine

## 2021-11-12 ENCOUNTER — Telehealth: Payer: Self-pay | Admitting: Family Medicine

## 2021-11-12 NOTE — Telephone Encounter (Signed)
Left message for patient to call back and schedule Medicare Annual Wellness Visit (AWV) either virtually or in office. Left  my Ryan Daniel number [phone]   Last AWVi 11/22/20 Can schedule calendar year [employer] please schedule at anytime with [facility] Nurse Health Advisor 1 or 2   This should be a 45 minute visit.

## 2021-11-13 NOTE — Telephone Encounter (Signed)
Returned patients call   Patient returned my call 

## 2021-11-17 ENCOUNTER — Ambulatory Visit (INDEPENDENT_AMBULATORY_CARE_PROVIDER_SITE_OTHER): Payer: Medicare HMO

## 2021-11-17 VITALS — Ht 70.0 in | Wt 215.0 lb

## 2021-11-17 DIAGNOSIS — Z0000 Encounter for general adult medical examination without abnormal findings: Secondary | ICD-10-CM | POA: Diagnosis not present

## 2021-11-17 NOTE — Patient Instructions (Signed)
Ryan Daniel , Thank you for taking time to come for your Medicare Wellness Visit. I appreciate your ongoing commitment to your health goals. Please review the following plan we discussed and let me know if I can assist you in the future.   Screening recommendations/referrals: Colonoscopy: completed 11/29/2017 Recommended yearly ophthalmology/optometry visit for glaucoma screening and checkup Recommended yearly dental visit for hygiene and checkup  Vaccinations: Influenza vaccine: decline Pneumococcal vaccine: decline Tdap vaccine: due Shingles vaccine: decline   Covid-19: decline  Advanced directives: Advance directive discussed with you today. E  Conditions/risks identified: none  Next appointment: Follow up in one year for your annual wellness visit.   Preventive Care 59 Years and Older, Male Preventive care refers to lifestyle choices and visits with your health care provider that can promote health and wellness. What does preventive care include? A yearly physical exam. This is also called an annual well check. Dental exams once or twice a year. Routine eye exams. Ask your health care provider how often you should have your eyes checked. Personal lifestyle choices, including: Daily care of your teeth and gums. Regular physical activity. Eating a healthy diet. Avoiding tobacco and drug use. Limiting alcohol use. Practicing safe sex. Taking low doses of aspirin every day. Taking vitamin and mineral supplements as recommended by your health care provider. What happens during an annual well check? The services and screenings done by your health care provider during your annual well check will depend on your age, overall health, lifestyle risk factors, and family history of disease. Counseling  Your health care provider may ask you questions about your: Alcohol use. Tobacco use. Drug use. Emotional well-being. Home and relationship well-being. Sexual activity. Eating  habits. History of falls. Memory and ability to understand (cognition). Work and work Statistician. Screening  You may have the following tests or measurements: Height, weight, and BMI. Blood pressure. Lipid and cholesterol levels. These may be checked every 5 years, or more frequently if you are over 39 years old. Skin check. Lung cancer screening. You may have this screening every year starting at age 70 if you have a 30-pack-year history of smoking and currently smoke or have quit within the past 15 years. Fecal occult blood test (FOBT) of the stool. You may have this test every year starting at age 44. Flexible sigmoidoscopy or colonoscopy. You may have a sigmoidoscopy every 5 years or a colonoscopy every 10 years starting at age 52. Prostate cancer screening. Recommendations will vary depending on your family history and other risks. Hepatitis C blood test. Hepatitis B blood test. Sexually transmitted disease (STD) testing. Diabetes screening. This is done by checking your blood sugar (glucose) after you have not eaten for a while (fasting). You may have this done every 1-3 years. Abdominal aortic aneurysm (AAA) screening. You may need this if you are a current or former smoker. Osteoporosis. You may be screened starting at age 14 if you are at high risk. Talk with your health care provider about your test results, treatment options, and if necessary, the need for more tests. Vaccines  Your health care provider may recommend certain vaccines, such as: Influenza vaccine. This is recommended every year. Tetanus, diphtheria, and acellular pertussis (Tdap, Td) vaccine. You may need a Td booster every 10 years. Zoster vaccine. You may need this after age 11. Pneumococcal 13-valent conjugate (PCV13) vaccine. One dose is recommended after age 44. Pneumococcal polysaccharide (PPSV23) vaccine. One dose is recommended after age 55. Talk to your health care  provider about which screenings and  vaccines you need and how often you need them. This information is not intended to replace advice given to you by your health care provider. Make sure you discuss any questions you have with your health care provider. Document Released: 11/22/2015 Document Revised: 07/15/2016 Document Reviewed: 08/27/2015 Elsevier Interactive Patient Education  2017 [employer] Prevention in the Home Falls can cause injuries. They can happen to people of all ages. There are many things you can do to make your home safe and to help prevent falls. What can I do on the outside of my home? Regularly fix the edges of walkways and driveways and fix any cracks. Remove anything that might make you trip as you walk through a door, such as a raised step or threshold. Trim any bushes or trees on the path to your home. Use bright outdoor lighting. Clear any walking paths of anything that might make someone trip, such as rocks or tools. Regularly check to see if handrails are loose or broken. Make sure that both sides of any steps have handrails. Any raised decks and porches should have guardrails on the edges. Have any leaves, snow, or ice cleared regularly. Use sand or salt on walking paths during winter. Clean up any spills in your garage right away. This includes oil or grease spills. What can I do in the bathroom? Use night lights. Install grab bars by the toilet and in the tub and shower. Do not use towel bars as grab bars. Use non-skid mats or decals in the tub or shower. If you need to sit down in the shower, use a plastic, non-slip stool. Keep the floor dry. Clean up any water that spills on the floor as soon as it happens. Remove soap buildup in the tub or shower regularly. Attach bath mats securely with double-sided non-slip rug tape. Do not have throw rugs and other things on the floor that can make you trip. What can I do in the bedroom? Use night lights. Make sure that you have a light by your  bed that is easy to reach. Do not use any sheets or blankets that are too big for your bed. They should not hang down onto the floor. Have a firm chair that has side arms. You can use this for support while you get dressed. Do not have throw rugs and other things on the floor that can make you trip. What can I do in the kitchen? Clean up any spills right away. Avoid walking on wet floors. Keep items that you use a lot in easy-to-reach places. If you need to reach something above you, use a strong step stool that has a grab bar. Keep electrical cords out of the way. Do not use floor polish or wax that makes floors slippery. If you must use wax, use non-skid floor wax. Do not have throw rugs and other things on the floor that can make you trip. What can I do with my stairs? Do not leave any items on the stairs. Make sure that there are handrails on both sides of the stairs and use them. Fix handrails that are broken or loose. Make sure that handrails are as long as the stairways. Check any carpeting to make sure that it is firmly attached to the stairs. Fix any carpet that is loose or worn. Avoid having throw rugs at the top or bottom of the stairs. If you do have throw rugs, attach them to the  floor with carpet tape. Make sure that you have a light switch at the top of the stairs and the bottom of the stairs. If you do not have them, ask someone to add them for you. What else can I do to help prevent falls? Wear shoes that: Do not have high heels. Have rubber bottoms. Are comfortable and fit you well. Are closed at the toe. Do not wear sandals. If you use a stepladder: Make sure that it is fully opened. Do not climb a closed stepladder. Make sure that both sides of the stepladder are locked into place. Ask someone to hold it for you, if possible. Clearly mark and make sure that you can see: Any grab bars or handrails. First and last steps. Where the edge of each step is. Use tools that  help you move around (mobility aids) if they are needed. These include: Canes. Walkers. Scooters. Crutches. Turn on the lights when you go into a dark area. Replace any light bulbs as soon as they burn out. Set up your furniture so you have a clear path. Avoid moving your furniture around. If any of your floors are uneven, fix them. If there are any pets around you, be aware of where they are. Review your medicines with your doctor. Some medicines can make you feel dizzy. This can increase your chance of falling. Ask your doctor what other things that you can do to help prevent falls. This information is not intended to replace advice given to you by your health care provider. Make sure you discuss any questions you have with your health care provider. Document Released: 08/22/2009 Document Revised: 04/02/2016 Document Reviewed: 11/30/2014 Elsevier Interactive Patient Education  2017 [employer].

## 2021-11-17 NOTE — Progress Notes (Addendum)
HPI: [name] is a 71 y.o.male here today for his routine physical examination. He has some concerns today, asked him if we can just address these today and re-schedule CPE, he would like it done today as well.  Last CPE: 05/15/20  Regular exercise 3 or more times per week: He is "trying to walk a little bit" but limitations due to SOB. Following a healthy diet: Decreased carb intake, changed to whole wheat breath.Seldom fried food. A lot of protein, some vegetables, he cooks at home.  Chronic medical problems: HLD,GERD,ED,HTN.  Immunization History  Administered Date(s) Administered   Pneumococcal Conjugate-13 02/23/2017   Health Maintenance  Topic Date Due   COVID-19 Vaccine (1) 12/03/2021 (Originally 07/20/1951)   INFLUENZA VACCINE  02/06/2022 (Originally 06/09/2021)   Zoster Vaccines- Shingrix (1 of 2) 02/15/2022 (Originally 01/18/1970)   Pneumonia Vaccine 4+ Years old (2 - PPSV23 if available, else PCV20) 11/17/2022 (Originally 02/23/2018)   COLONOSCOPY (Pts 45-80yrs Insurance coverage will need to be confirmed)  11/30/2027   Hepatitis C Screening  Completed   HPV VACCINES  Aged Out   TETANUS/TDAP  Discontinued   Last prostate ca screening: 05/20/2020. Nocturia x 2-4, stable for years.  -Negative for high alcohol intake. Started smoking cigars, once daily. Former cig smoker, started at age 42 and until 2019, smoke in average 1 PPD. Abdominal CT in 11/2019 negative for aortic aneurysm.  -Concerns and/or follow up today:  Hyperlipidemia: Currently he is on fenofibrate 145 mg daily. She tried some statins in the past, caused nausea.  Lab Results  Component Value Date   CHOL 268 (H) 11/20/2020   HDL 38.20 (L) 11/20/2020   LDLCALC 135 (H) 05/20/2020   LDLDIRECT 144.0 11/20/2020   TRIG (H) 11/20/2020    502.0 Triglyceride is over 400; calculations on Lipids are invalid.   CHOLHDL 7 11/20/2020   Hypertension on atenolol 100 mg daily. He does not check BP at  home.  Lab Results  Component Value Date   CREATININE 1.40 11/20/2020   BUN 22 11/20/2020   NA 136 11/20/2020   K 4.7 11/20/2020   CL 97 11/20/2020   CO2 32 11/20/2020   Concerned about fatigue, sometimes he does "not have a lot of energy." No sleepy or drowsy during the day, feels rested when he gets up in the morning. + Snoring when lying on right side. Sleeping about 10 hours.  Sometimes he has difficulty breathing, concerned about "lung capacity." SOB for about a year, since he was in inpt rehab and after discharge. Walking stairs exacerbates problem, associated diaphoresis no other symptom.  Problem has been stable.  He has a girlfriend, "hard to keep with her sometimes." He has taken Viagra, medication helps but 45 min after taking med he has flush like sensation. No headache ,CP,or dizziness.  Hypo mg: He is on Mg supplementation.  Review of Systems  Constitutional:  Positive for fatigue. Negative for activity change, appetite change, fever and unexpected weight change.  HENT:  Negative for mouth sores, nosebleeds, sore throat, trouble swallowing and voice change.   Eyes:  Negative for redness and visual disturbance.  Respiratory:  Positive for shortness of breath. Negative for cough and wheezing.   Cardiovascular:  Negative for chest pain, palpitations and leg swelling.  Gastrointestinal:  Negative for abdominal pain, blood in stool, nausea and vomiting.  Endocrine: Negative for cold intolerance, heat intolerance, polydipsia, polyphagia and polyuria.  Genitourinary:  Negative for decreased urine volume, dysuria, genital sores, hematuria and testicular  pain.  Musculoskeletal:  Negative for gait problem and myalgias.  Skin:  Negative for color change and rash.  Neurological:  Negative for syncope, weakness and headaches.  Hematological:  Negative for adenopathy. Does not bruise/bleed easily.  Psychiatric/Behavioral:  Negative for confusion and sleep disturbance.   All  other systems reviewed and are negative.  Current Outpatient Medications on File Prior to Visit  Medication Sig Dispense Refill   fenofibrate (TRICOR) 145 MG tablet TAKE 1 TABLET EVERY DAY 90 tablet 3   magnesium oxide (MAG-OX) 400 MG tablet Take 1 tablet (400 mg total) by mouth 2 (two) times daily. 180 tablet 1   Multiple Vitamin (THERA) TABS Take by mouth.     omeprazole (PRILOSEC) 10 MG capsule Take 10 mg by mouth daily.     sertraline (ZOLOFT) 50 MG tablet TAKE 1 TABLET EVERY DAY 90 tablet 3   sildenafil (REVATIO) 20 MG tablet Take 1-3 tablets (20-60 mg total) by mouth daily as needed. 90 tablet 0   vitamin B-12 (CYANOCOBALAMIN) 1000 MCG tablet Take 1 tablet (1,000 mcg total) by mouth daily.     No current facility-administered medications on file prior to visit.   Past Medical History:  Diagnosis Date   DEPRESSION 02/05/2010   GERD 02/05/2010   HYPERTENSION 02/05/2010   TOBACCO ABUSE 09/15/2010   History reviewed. No pertinent surgical history.  Allergies  Allergen Reactions   Oxycodone Nausea And Vomiting   Amlodipine    Prochlorperazine Edisylate     Family History  Problem Relation Age of Onset   Cancer Maternal Grandmother        colon   Heart disease Paternal Grandfather     Social History   Socioeconomic History   Marital status: Single    Spouse name: Not on file   Number of children: Not on file   Years of education: Not on file   Highest education level: Not on file  Occupational History   Not on file  Tobacco Use   Smoking status: Every Day    Packs/day: 0.50    Types: Cigarettes, Cigars    Last attempt to quit: 11/01/2018    Years since quitting: 3.0   Smokeless tobacco: Never   Tobacco comments:    1 cigar daily for the past year.  Vaping Use   Vaping Use: Never used  Substance and Sexual Activity   Alcohol use: Yes    Alcohol/week: 7.0 standard drinks    Types: 7 Standard drinks or equivalent per week   Drug use: No   Sexual activity: Yes     Partners: Female  Other Topics Concern   Not on file  Social History Narrative   Not on file   Social Determinants of Health   Financial Resource Strain: Low Risk    Difficulty of Paying Living Expenses: Not hard at all  Food Insecurity: No Food Insecurity   Worried About Charity fundraiser in the Last Year: Never true   [place] in the Last Year: Never true  Transportation Needs: No Transportation Needs   Lack of Transportation (Medical): No   Lack of Transportation (Non-Medical): No  Physical Activity: Insufficiently Active   Days of Exercise per Week: 4 days   Minutes of Exercise per Session: 30 min  Stress: No Stress Concern Present   Feeling of Stress : Not at all  Social Connections: Socially Isolated   Frequency of Communication with Friends and Family: More than three times a week  Frequency of Social Gatherings with Friends and Family: Twice a week   Attends Religious Services: Never   Marine scientist or Organizations: No   Attends Archivist Meetings: Never   Marital Status: Divorced   Vitals:   11/18/21 0751  BP: 130/80  Pulse: 80  Resp: 16  SpO2: 97%   Body mass index is 31.06 kg/m.  Wt Readings from Last 3 Encounters:  11/18/21 216 lb 8 oz (98.2 kg)  11/17/21 215 lb (97.5 kg)  11/20/20 208 lb (94.3 kg)   Physical Exam Vitals and nursing note reviewed.  Constitutional:      General: He is not in acute distress.    Appearance: He is well-developed and well-groomed.  HENT:     Head: Normocephalic and atraumatic.     Right Ear: Tympanic membrane, ear canal and external ear normal.     Left Ear: Tympanic membrane, ear canal and external ear normal.     Mouth/Throat:     Mouth: Mucous membranes are moist.     Pharynx: Oropharynx is clear.  Eyes:     Conjunctiva/sclera: Conjunctivae normal.     Pupils: Pupils are equal, round, and reactive to light.  Neck:     Thyroid: No thyroid mass.     Vascular: No JVD.   Cardiovascular:     Rate and Rhythm: Normal rate and regular rhythm.     Heart sounds: No murmur heard.    Comments: Trace pitting LE edema, bilateral. DP and PT pulses present, bilateral. Normal capillary refill. Pulmonary:     Effort: Pulmonary effort is normal. No respiratory distress.     Breath sounds: Normal breath sounds.  Abdominal:     Palpations: Abdomen is soft. There is no hepatomegaly or mass.     Tenderness: There is no abdominal tenderness.  Genitourinary:    Comments: No concerns. Musculoskeletal:        General: No tenderness.     Cervical back: Normal range of motion.  Lymphadenopathy:     Cervical: No cervical adenopathy.  Skin:    General: Skin is warm.     Findings: No erythema.  Neurological:     Mental Status: He is alert and oriented to person, place, and time.     Cranial Nerves: No cranial nerve deficit.     Sensory: No sensory deficit.     Gait: Gait normal.     Deep Tendon Reflexes:     Reflex Scores:      Bicep reflexes are 2+ on the right side and 2+ on the left side.      Patellar reflexes are 2+ on the right side and 2+ on the left side. Psychiatric:        Mood and Affect: Mood is not anxious or depressed.    ASSESSMENT AND PLAN:  Mr.Marques was seen today for annual exam, shortness of breath, fatigue and follow-up.  Diagnoses and all orders for this visit: Orders Placed This Encounter  Procedures   DG Chest 2 View   Comprehensive metabolic panel   CBC   Lipid panel   Magnesium   TSH   Brain Natriuretic Peptide   Urinalysis, Routine w reflex microscopic   Ambulatory Referral for Lung Cancer Scre   EKG 12-Lead   Lab Results  Component Value Date   CREATININE 1.72 (H) 11/18/2021   BUN 22 11/18/2021   NA 139 11/18/2021   K 4.5 11/18/2021   CL 100 11/18/2021   CO2 31  11/18/2021   Lab Results  Component Value Date   ALT 20 11/18/2021   AST 24 11/18/2021   ALKPHOS 45 11/18/2021   BILITOT 0.4 11/18/2021   Lab Results   Component Value Date   CHOL 241 (H) 11/18/2021   HDL 42.00 11/18/2021   LDLCALC 164 (H) 11/18/2021   LDLDIRECT 144.0 11/20/2020   TRIG 173.0 (H) 11/18/2021   CHOLHDL 6 11/18/2021   Lab Results  Component Value Date   TSH 1.30 11/18/2021   Lab Results  Component Value Date   WBC 8.4 11/18/2021   HGB 14.4 11/18/2021   HCT 43.2 11/18/2021   MCV 91.7 11/18/2021   PLT 321.0 11/18/2021   Routine general medical examination at a health care facility We discussed the importance of regular physical activity and healthy diet for prevention of chronic illness and/or complications. Preventive guidelines reviewed. Vaccination: Refused vaccination. She is not interested in prostate cancer screening. Next CPE in a year.  The 10-year ASCVD risk score (Arnett DK, et al., 2019) is: 29.1%   Values used to calculate the score:     Age: 17 years     Sex: Male     Is Non-Hispanic African American: No     Diabetic: No     Tobacco smoker: Yes     Systolic Blood Pressure: 096 mmHg     Is BP treated: Yes     HDL Cholesterol: 42 mg/dL     Total Cholesterol: 241 mg/dL  DOE (dyspnea on exertion) We discussed possible etiologies. Problem seems to be chronic and stable. Former smoker, so COPD is to be considered, no wheezing or cough associated. He agrees with trial ICS/LABA, Symbicort 160/4.5 mcg bid.  EKG today mild sinus arrhythmia. Normal axis and intervals, no ST changes, ? LAE.No other EKG available for comparison. We will decide about cardiology or pulmonology evaluation according to imaging/lab results. Instructed about warning signs.  -     budesonide-formoterol (SYMBICORT) 160-4.5 MCG/ACT inhaler; Inhale 2 puffs into the lungs 2 (two) times daily.  Other fatigue We discussed possible etiologies: Systemic illness, immunologic,endocrinology,sleep disorder, psychiatric/psychologic, infectious,medications side effects, and idiopathic. Examination today does not suggest a serious  process. Today we decreased Atenolol dose from 100 mg to 50 mg.  Further recommendations will be given according to lab results.  Nocturia Stable for years. Recommend trying to limit fluid intake 3-4 hours before bedtime. Further recommendations according to UA results.  Hypomagnesemia Continue current dose of Mg Oxide, further recommendations according to Mg result.  Hyperlipidemia, mixed Statins caused nausea in the past. Continue fenofibrate for now and low fat diet. Further recommendations according to FLP results.  Essential hypertension BP adequately controlled. Because bradycardia, recommend decreasing dose of Atenolol from 100 mg to 50 mg daily. Monitor BP at home. Continue low salt diet. Instructed to let me know about BP readings in 2-3 weeks.  Erectile dysfunction Continue current management. Some side effects of Sildenafil discussed.  Return in 2 months (on 01/16/2022) for SOB and fatigue.Marland Kitchen  Sophy Mesler G. Martinique, MD  [facility]. [place] office.

## 2021-11-17 NOTE — Progress Notes (Signed)
I connected with  Ryan Daniel today via telehealth video enabled device and verified that I am speaking with the correct person using two identifiers.   Location: Patient: home Provider: work  Persons participating in virtual visit: Ryan Daniel, Glenna Durand LPN  I discussed the limitations, risks, security and privacy concerns of performing an evaluation and management service by video and the availability of in person appointments. The patient expressed understanding and agreed to proceed.   Some vital signs may be absent or patient reported.     Subjective:   Ryan Daniel is a 71 y.o. male who presents for Medicare Annual/Subsequent preventive examination.  Review of Systems     Cardiac Risk Factors include: advanced age (>64men, >1 women);dyslipidemia;male gender;obesity (BMI >30kg/m2)     Objective:    Today's Vitals   11/17/21 1601  Weight: 215 lb (97.5 kg)  Height: 5\' 10"  (1.778 m)   Body mass index is 30.85 kg/m.  Advanced Directives 11/17/2021 11/22/2020 12/26/2019  Does Patient Have a Medical Advance Directive? No No No  Would patient like information on creating a medical advance directive? - No - Patient declined -    Current Medications (verified) Outpatient Encounter Medications as of 11/17/2021  Medication Sig   atenolol (TENORMIN) 100 MG tablet TAKE 1 TABLET EVERY DAY   fenofibrate (TRICOR) 145 MG tablet TAKE 1 TABLET EVERY DAY   magnesium oxide (MAG-OX) 400 MG tablet Take 1 tablet (400 mg total) by mouth 2 (two) times daily.   Multiple Vitamin (THERA) TABS Take by mouth.   omeprazole (PRILOSEC) 10 MG capsule Take 10 mg by mouth daily.   sertraline (ZOLOFT) 50 MG tablet TAKE 1 TABLET EVERY DAY   sildenafil (REVATIO) 20 MG tablet Take 1-3 tablets (20-60 mg total) by mouth daily as needed.   vitamin B-12 (CYANOCOBALAMIN) 1000 MCG tablet Take 1 tablet (1,000 mcg total) by mouth daily.   No facility-administered encounter medications on file as of 11/17/2021.     Allergies (verified) Oxycodone, Amlodipine, and Prochlorperazine edisylate   History: Past Medical History:  Diagnosis Date   DEPRESSION 02/05/2010   GERD 02/05/2010   HYPERTENSION 02/05/2010   TOBACCO ABUSE 09/15/2010   History reviewed. No pertinent surgical history. Family History  Problem Relation Age of Onset   Cancer Maternal Grandmother        colon   Heart disease Paternal Grandfather    Social History   Socioeconomic History   Marital status: Single    Spouse name: Not on file   Number of children: Not on file   Years of education: Not on file   Highest education level: Not on file  Occupational History   Not on file  Tobacco Use   Smoking status: Former    Packs/day: 0.50    Types: Cigarettes    Quit date: 11/01/2018    Years since quitting: 3.0   Smokeless tobacco: Never  Vaping Use   Vaping Use: Never used  Substance and Sexual Activity   Alcohol use: Yes    Alcohol/week: 7.0 standard drinks    Types: 7 Standard drinks or equivalent per week   Drug use: No   Sexual activity: Yes    Partners: Female  Other Topics Concern   Not on file  Social History Narrative   Not on file   Social Determinants of Health   Financial Resource Strain: Low Risk    Difficulty of Paying Living Expenses: Not hard at all  Food Insecurity: No Food Insecurity  Worried About Charity fundraiser in the Last Year: Never true   [place] in the Last Year: Never true  Transportation Needs: No Transportation Needs   Lack of Transportation (Medical): No   Lack of Transportation (Non-Medical): No  Physical Activity: Insufficiently Active   Days of Exercise per Week: 4 days   Minutes of Exercise per Session: 30 min  Stress: No Stress Concern Present   Feeling of Stress : Not at all  Social Connections: Socially Isolated   Frequency of Communication with Friends and Family: More than three times a week   Frequency of Social Gatherings with Friends and Family: Twice  a week   Attends Religious Services: Never   Marine scientist or Organizations: No   Attends Music therapist: Never   Marital Status: Divorced    Tobacco Counseling Counseling given: Not Answered   Clinical Intake:  Pre-visit preparation completed: Yes  Pain : No/denies pain     Nutritional Status: BMI > 30  Obese Nutritional Risks: None Diabetes: No  How often do you need to have someone help you when you read instructions, pamphlets, or other written materials from your doctor or pharmacy?: 1 - Never What is the last grade level you completed in school?: 42yr college  Diabetic? no  Interpreter Needed?: No  Information entered by :: NAllen LPN   Activities of Daily Living In your present state of health, do you have any difficulty performing the following activities: 11/17/2021 11/22/2020  Hearing? N N  Vision? 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 N N  Walking or climbing stairs? Y Y  Comment SOB -  Dressing or bathing? N N  Doing errands, shopping? N N  Preparing Food and eating ? N N  Using the Toilet? N N  In the past six months, have you accidently leaked urine? N N  Do you have problems with loss of bowel control? N N  Managing your Medications? N N  Managing your Finances? N N  Housekeeping or managing your Housekeeping? N N  Some recent data might be hidden    Patient Care Team: Martinique, Betty G, MD as PCP - General (Family Medicine)  Indicate any recent Medical Services you may have received from other than [facility] providers in the past year (date may be approximate).     Assessment:   This is a routine wellness examination for [facility].  Hearing/Vision screen Vision Screening - Comments:: No regular eye exams, [facility]  Dietary issues and exercise activities discussed: Current Exercise Habits: Home exercise routine, Type of exercise: walking, Time  (Minutes): 30, Frequency (Times/Week): 4, Weekly Exercise (Minutes/Week): 120   Goals Addressed             This Visit's Progress    Patient Stated       11/17/2021, wants to lose 15-20 pounds       Depression Screen PHQ 2/9 Scores 11/17/2021 11/22/2020 11/20/2020  PHQ - 2 Score 0 0 0  PHQ- 9 Score - 0 0    Fall Risk Fall Risk  11/17/2021 11/22/2020  Falls in the past year? 0 1  Number falls in past yr: - 0  Injury with Fall? - 1  Comment - tripped on speed bump, broke nose  Risk for fall due to : - History of fall(s);Impaired vision  Follow up Falls evaluation completed;Education provided;Falls prevention  discussed Falls evaluation completed;Falls prevention discussed    FALL RISK PREVENTION PERTAINING TO THE HOME:  Any stairs in or around the home? Yes  If so, are there any without handrails? No  Home free of loose throw rugs in walkways, pet beds, electrical cords, etc? Yes  Adequate lighting in your home to reduce risk of falls? Yes   ASSISTIVE DEVICES UTILIZED TO PREVENT FALLS:  Life alert? No  Use of a cane, walker or w/c? No  Grab bars in the bathroom? No  Shower chair or bench in shower? No  Elevated toilet seat or a handicapped toilet? No   TIMED UP AND GO:  Was the test performed? No .      Cognitive Function:     6CIT Screen 11/17/2021  What Year? 0 points  What month? 0 points  What time? 0 points  Count back from 20 0 points  Months in reverse 0 points  Repeat phrase 0 points  Total Score 0    Immunizations Immunization History  Administered Date(s) Administered   Pneumococcal Conjugate-13 02/23/2017    TDAP status: Due, Education has been provided regarding the importance of this vaccine. Advised may receive this vaccine at local pharmacy or Health Dept. Aware to provide a copy of the vaccination record if obtained from local pharmacy or Health Dept. Verbalized acceptance and understanding.  Flu Vaccine status: Declined, Education has been  provided regarding the importance of this vaccine but patient still declined. Advised may receive this vaccine at local pharmacy or Health Dept. Aware to provide a copy of the vaccination record if obtained from local pharmacy or Health Dept. Verbalized acceptance and understanding.  Pneumococcal vaccine status: Declined,  Education has been provided regarding the importance of this vaccine but patient still declined. Advised may receive this vaccine at local pharmacy or Health Dept. Aware to provide a copy of the vaccination record if obtained from local pharmacy or Health Dept. Verbalized acceptance and understanding.   Covid-19 vaccine status: Completed vaccines  Qualifies for Shingles Vaccine? Yes   Zostavax completed No   Shingrix Completed?: No.    Education has been provided regarding the importance of this vaccine. Patient has been advised to call insurance company to determine out of pocket expense if they have not yet received this vaccine. Advised may also receive vaccine at local pharmacy or Health Dept. Verbalized acceptance and understanding.  Screening Tests Health Maintenance  Topic Date Due   COVID-19 Vaccine (1) 12/03/2021 (Originally 07/20/1951)   INFLUENZA VACCINE  02/06/2022 (Originally 06/09/2021)   Zoster Vaccines- Shingrix (1 of 2) 02/15/2022 (Originally 01/18/1970)   Pneumonia Vaccine 32+ Years old (2 - PPSV23 if available, else PCV20) 11/17/2022 (Originally 02/23/2018)   COLONOSCOPY (Pts 45-102yrs Insurance coverage will need to be confirmed)  11/30/2027   Hepatitis C Screening  Completed   HPV VACCINES  Aged Out   TETANUS/TDAP  Discontinued    Health Maintenance  There are no preventive care reminders to display for this patient.   Colorectal cancer screening: Type of screening: Colonoscopy. Completed 11/29/2017. Repeat every 10 years  Lung Cancer Screening: (Low Dose CT Chest recommended if Age 33-80 years, 30 pack-year currently smoking OR have quit w/in 15years.)  does not qualify.   Lung Cancer Screening Referral: no  Additional Screening:  Hepatitis C Screening: does qualify; Completed 02/23/2017  Vision Screening: Recommended annual ophthalmology exams for early detection of glaucoma and other disorders of the eye. Is the patient up to date with their annual eye  exam?  Yes  Who is the provider or what is the name of the office in which the patient attends annual eye exams? [facility] If pt is not established with a provider, would they like to be referred to a provider to establish care? No .   Dental Screening: Recommended annual dental exams for proper oral hygiene  Community Resource Referral / Chronic Care Management: CRR required this visit?  No   CCM required this visit?  No      Plan:     I have personally reviewed and noted the following in the patients chart:   Medical and social history Use of alcohol, tobacco or illicit drugs  Current medications and supplements including opioid prescriptions. Patient is not currently taking opioid prescriptions. Functional ability and status Nutritional status Physical activity Advanced directives List of other physicians Hospitalizations, surgeries, and ER visits in previous 12 months Vitals Screenings to include cognitive, depression, and falls Referrals and appointments  In addition, I have reviewed and discussed with patient certain preventive protocols, quality metrics, and best practice recommendations. A written personalized care plan for preventive services as well as general preventive health recommendations were provided to patient.     Kellie Simmering, LPN   11/17/3435   Nurse Notes: none

## 2021-11-18 ENCOUNTER — Other Ambulatory Visit: Payer: Self-pay

## 2021-11-18 ENCOUNTER — Ambulatory Visit (INDEPENDENT_AMBULATORY_CARE_PROVIDER_SITE_OTHER): Payer: Medicare HMO

## 2021-11-18 ENCOUNTER — Telehealth: Payer: Self-pay

## 2021-11-18 ENCOUNTER — Ambulatory Visit (INDEPENDENT_AMBULATORY_CARE_PROVIDER_SITE_OTHER): Payer: Medicare HMO | Admitting: Family Medicine

## 2021-11-18 ENCOUNTER — Encounter: Payer: Self-pay | Admitting: Family Medicine

## 2021-11-18 VITALS — BP 130/80 | HR 80 | Resp 16 | Ht 70.0 in | Wt 216.5 lb

## 2021-11-18 DIAGNOSIS — I10 Essential (primary) hypertension: Secondary | ICD-10-CM

## 2021-11-18 DIAGNOSIS — R0609 Other forms of dyspnea: Secondary | ICD-10-CM | POA: Diagnosis not present

## 2021-11-18 DIAGNOSIS — R0600 Dyspnea, unspecified: Secondary | ICD-10-CM | POA: Diagnosis not present

## 2021-11-18 DIAGNOSIS — E782 Mixed hyperlipidemia: Secondary | ICD-10-CM

## 2021-11-18 DIAGNOSIS — R5383 Other fatigue: Secondary | ICD-10-CM | POA: Diagnosis not present

## 2021-11-18 DIAGNOSIS — N529 Male erectile dysfunction, unspecified: Secondary | ICD-10-CM | POA: Diagnosis not present

## 2021-11-18 DIAGNOSIS — R351 Nocturia: Secondary | ICD-10-CM | POA: Diagnosis not present

## 2021-11-18 DIAGNOSIS — R809 Proteinuria, unspecified: Secondary | ICD-10-CM

## 2021-11-18 DIAGNOSIS — Z122 Encounter for screening for malignant neoplasm of respiratory organs: Secondary | ICD-10-CM

## 2021-11-18 DIAGNOSIS — Z0000 Encounter for general adult medical examination without abnormal findings: Secondary | ICD-10-CM | POA: Diagnosis not present

## 2021-11-18 DIAGNOSIS — I517 Cardiomegaly: Secondary | ICD-10-CM | POA: Diagnosis not present

## 2021-11-18 LAB — COMPREHENSIVE METABOLIC PANEL
ALT: 20 U/L (ref 0–53)
AST: 24 U/L (ref 0–37)
Albumin: 4.4 g/dL (ref 3.5–5.2)
Alkaline Phosphatase: 45 U/L (ref 39–117)
BUN: 22 mg/dL (ref 6–23)
CO2: 31 mEq/L (ref 19–32)
Calcium: 8.8 mg/dL (ref 8.4–10.5)
Chloride: 100 mEq/L (ref 96–112)
Creatinine, Ser: 1.72 mg/dL — ABNORMAL HIGH (ref 0.40–1.50)
GFR: 39.97 mL/min — ABNORMAL LOW (ref >60.00)
Glucose, Bld: 97 mg/dL (ref 70–99)
Potassium: 4.5 mEq/L (ref 3.5–5.1)
Sodium: 139 mEq/L (ref 135–145)
Total Bilirubin: 0.4 mg/dL (ref 0.2–1.2)
Total Protein: 8.1 g/dL (ref 6.0–8.3)

## 2021-11-18 LAB — URINALYSIS, ROUTINE W REFLEX MICROSCOPIC
Bilirubin Urine: NEGATIVE
Hgb urine dipstick: NEGATIVE
Ketones, ur: NEGATIVE
Leukocytes,Ua: NEGATIVE
Nitrite: NEGATIVE
Specific Gravity, Urine: 1.025 (ref 1.000–1.030)
Total Protein, Urine: >=300 — AB
Urine Glucose: NEGATIVE
Urobilinogen, UA: 0.2 (ref 0.0–1.0)
pH: 6 (ref 5.0–8.0)

## 2021-11-18 LAB — LIPID PANEL
Cholesterol: 241 mg/dL — ABNORMAL HIGH (ref 0–200)
HDL: 42 mg/dL (ref >39.00)
LDL Cholesterol: 164 mg/dL — ABNORMAL HIGH (ref 0–99)
NonHDL: 198.63
Total CHOL/HDL Ratio: 6
Triglycerides: 173 mg/dL — ABNORMAL HIGH (ref 0.0–149.0)
VLDL: 34.6 mg/dL (ref 0.0–40.0)

## 2021-11-18 LAB — TSH: TSH: 1.3 u[IU]/mL (ref 0.35–5.50)

## 2021-11-18 LAB — CBC
HCT: 43.2 % (ref 39.0–52.0)
Hemoglobin: 14.4 g/dL (ref 13.0–17.0)
MCHC: 33.2 g/dL (ref 30.0–36.0)
MCV: 91.7 fl (ref 78.0–100.0)
Platelets: 321 10*3/uL (ref 150.0–400.0)
RBC: 4.72 Mil/uL (ref 4.22–5.81)
RDW: 13.3 % (ref 11.5–15.5)
WBC: 8.4 10*3/uL (ref 4.0–10.5)

## 2021-11-18 LAB — MAGNESIUM: Magnesium: 0.8 mg/dL — CL (ref 1.5–2.5)

## 2021-11-18 LAB — BRAIN NATRIURETIC PEPTIDE: Pro B Natriuretic peptide (BNP): 91 pg/mL (ref 0.0–100.0)

## 2021-11-18 MED ORDER — ATENOLOL 100 MG PO TABS: ORAL_TABLET | ORAL | 0 refills | Status: DC

## 2021-11-18 MED ORDER — BUDESONIDE-FORMOTEROL FUMARATE 160-4.5 MCG/ACT IN AERO: 2.0000 | INHALATION_SPRAY | Freq: Two times a day (BID) | RESPIRATORY_TRACT | 3 refills | Status: DC

## 2021-11-18 NOTE — Assessment & Plan Note (Addendum)
Statins caused nausea in the past. Continue fenofibrate for now and low fat diet. Further recommendations according to FLP results.

## 2021-11-18 NOTE — Telephone Encounter (Signed)
He is supposed to be on Mg Oxide 400 mg bid, is he really taking it like prescribed?  We need to have it re-checked in 1 week. Thanks, BJ

## 2021-11-18 NOTE — Assessment & Plan Note (Signed)
Continue current dose of Mg Oxide, further recommendations according to Mg result.

## 2021-11-18 NOTE — Patient Instructions (Addendum)
A few things to remember from today's visit:  Routine general medical examination at a health care facility  DOE (dyspnea on exertion) - Plan: EKG 12-Lead, CBC, Brain Natriuretic Peptide, DG Chest 2 View  Hypomagnesemia - Plan: Magnesium  Hyperlipidemia, mixed - Plan: Comprehensive metabolic panel, Lipid panel  Essential hypertension - Plan: Comprehensive metabolic panel, atenolol (TENORMIN) 100 MG tablet  Other fatigue - Plan: CBC, TSH  Nocturia - Plan: Urinalysis, Routine w reflex microscopic  If you need refills please call your pharmacy. Do not use My Chart to request refills or for acute issues that need immediate attention.   Please be sure medication list is accurate. If a new problem present, please set up appointment sooner than planned today.  For shortness of breath let's try daily inhaler, Symbicort 2 puff 2 times daily. Swish after use. Based on chest X ray ,I may go a head and order more imaging and depressing of lab results we can consider cardiology evaluation. Decrease atenolol from 1 tab to 1/2 tab and monitor blood pressure and pulse at home.   Preventive Care 33 Years and Older, Male Preventive care refers to lifestyle choices and visits with your health care provider that can promote health and wellness. Preventive care visits are also called wellness exams. What can I expect for my preventive care visit? Counseling During your preventive care visit, your health care provider may ask about your: Medical history, including: Past medical problems. Family medical history. History of falls. Current health, including: Emotional well-being. Home life and relationship well-being. Sexual activity. Memory and ability to understand (cognition). Lifestyle, including: Alcohol, nicotine or tobacco, and drug use. Access to firearms. Diet, exercise, and sleep habits. Work and work Statistician. Sunscreen use. Safety issues such as seatbelt and bike helmet  use. Physical exam Your health care provider will check your: Height and weight. These may be used to calculate your BMI (body mass index). BMI is a measurement that tells if you are at a healthy weight. Waist circumference. This measures the distance around your waistline. This measurement also tells if you are at a healthy weight and may help predict your risk of certain diseases, such as type 2 diabetes and high blood pressure. Heart rate and blood pressure. Body temperature. Skin for abnormal spots. What immunizations do I need? Vaccines are usually given at various ages, according to a schedule. Your health care provider will recommend vaccines for you based on your age, medical history, and lifestyle or other factors, such as travel or where you work. What tests do I need? Screening Your health care provider may recommend screening tests for certain conditions. This may include: Lipid and cholesterol levels. Diabetes screening. This is done by checking your blood sugar (glucose) after you have not eaten for a while (fasting). Hepatitis C test. Hepatitis B test. HIV (human immunodeficiency virus) test. STI (sexually transmitted infection) testing, if you are at risk. Lung cancer screening. Colorectal cancer screening. Prostate cancer screening. Abdominal aortic aneurysm (AAA) screening. You may need this if you are a current or former smoker. Talk with your health care provider about your test results, treatment options, and if necessary, the need for more tests. Follow these instructions at home: Eating and drinking  Eat a diet that includes fresh fruits and vegetables, whole grains, lean protein, and low-fat dairy products. Limit your intake of foods with high amounts of sugar, saturated fats, and salt. Take vitamin and mineral supplements as recommended by your health care provider. Do not drink  alcohol if your health care provider tells you not to drink. If you drink  alcohol: Limit how much you have to 0-2 drinks a day. Know how much alcohol is in your drink. In the [place] one drink equals one 12 oz bottle of beer (355 mL), one 5 oz glass of wine (148 mL), or one 1 oz glass of hard liquor (44 mL). Lifestyle Brush your teeth every morning and night with fluoride toothpaste. Floss one time each day. Exercise for at least 30 minutes 5 or more days each week. Do not use any products that contain nicotine or tobacco. These products include cigarettes, chewing tobacco, and vaping devices, such as e-cigarettes. If you need help quitting, ask your health care provider. Do not use drugs. If you are sexually active, practice safe sex. Use a condom or other form of protection to prevent STIs. Take aspirin only as told by your health care provider. Make sure that you understand how much to take and what form to take. Work with your health care provider to find out whether it is safe and beneficial for you to take aspirin daily. Ask your health care provider if you need to take a cholesterol-lowering medicine (statin). Find healthy ways to manage stress, such as: Meditation, yoga, or listening to music. Journaling. Talking to a trusted person. Spending time with friends and family. Safety Always wear your seat belt while driving or riding in a vehicle. Do not drive: If you have been drinking alcohol. Do not ride with someone who has been drinking. When you are tired or distracted. While texting. If you have been using any mind-altering substances or drugs. Wear a helmet and other protective equipment during sports activities. If you have firearms in your house, make sure you follow all gun safety procedures. Minimize exposure to UV radiation to reduce your risk of skin cancer. What's next? Visit your health care provider once a year for an annual wellness visit. Ask your health care provider how often you should have your eyes and teeth checked. Stay up to date  on all vaccines. This information is not intended to replace advice given to you by your health care provider. Make sure you discuss any questions you have with your health care provider. Document Revised: 04/23/2021 Document Reviewed: 04/23/2021 Elsevier Patient Education  [place].

## 2021-11-18 NOTE — Telephone Encounter (Signed)
CRITICAL VALUE STICKER  CRITICAL VALUE: mag 0.9  RECEIVER (on-site recipient of call): Harshini Trent  DATE & TIME NOTIFIED: 12:21 pm   MESSENGER (representative from lab): Doren Custard  MD NOTIFIED: Dr Martinique  TIME OF NOTIFICATION: 12:43 pm   RESPONSE:

## 2021-11-18 NOTE — Assessment & Plan Note (Addendum)
BP adequately controlled. Because bradycardia, recommend decreasing dose of Atenolol from 100 mg to 50 mg daily. Monitor BP at home. Continue low salt diet. Instructed to let me know about BP readings in 2-3 weeks.

## 2021-11-18 NOTE — Assessment & Plan Note (Signed)
Continue current management. Some side effects of Sildenafil discussed.

## 2021-11-19 ENCOUNTER — Other Ambulatory Visit: Payer: Self-pay

## 2021-11-19 ENCOUNTER — Other Ambulatory Visit: Payer: Self-pay | Admitting: Family Medicine

## 2021-11-19 DIAGNOSIS — F3341 Major depressive disorder, recurrent, in partial remission: Secondary | ICD-10-CM

## 2021-11-19 DIAGNOSIS — R790 Abnormal level of blood mineral: Secondary | ICD-10-CM

## 2021-11-19 MED ORDER — ROSUVASTATIN CALCIUM 20 MG PO TABS: 20.0000 mg | ORAL_TABLET | Freq: Every day | ORAL | 3 refills | Status: DC

## 2021-11-19 NOTE — Telephone Encounter (Signed)
I left patient a voicemail to return my call. 

## 2021-11-19 NOTE — Telephone Encounter (Signed)
I spoke with patient. He is taking the magnesium twice a day as prescribed. I scheduled him to come back on Wednesday the 18th to repeat the magnesium & urine. He will continue taking as prescribed for now. Is there anything else he needs to do?

## 2021-11-20 NOTE — Telephone Encounter (Signed)
If he is taking Mg Oxide 400 mg 2 times daily, increased to 3 tabs daily. Thanks, BJ

## 2021-11-20 NOTE — Telephone Encounter (Signed)
I left patient a voicemail letting him know to increase his magnesium to 3 times daily.

## 2021-11-26 ENCOUNTER — Other Ambulatory Visit (INDEPENDENT_AMBULATORY_CARE_PROVIDER_SITE_OTHER): Payer: Medicare HMO

## 2021-11-26 ENCOUNTER — Other Ambulatory Visit: Payer: Self-pay | Admitting: Family Medicine

## 2021-11-26 ENCOUNTER — Encounter: Payer: Self-pay | Admitting: Family Medicine

## 2021-11-26 ENCOUNTER — Telehealth: Payer: Self-pay

## 2021-11-26 DIAGNOSIS — R809 Proteinuria, unspecified: Secondary | ICD-10-CM | POA: Diagnosis not present

## 2021-11-26 DIAGNOSIS — N1831 Chronic kidney disease, stage 3a: Secondary | ICD-10-CM

## 2021-11-26 DIAGNOSIS — R790 Abnormal level of blood mineral: Secondary | ICD-10-CM

## 2021-11-26 LAB — MAGNESIUM: Magnesium: 0.8 mg/dL — CL (ref 1.5–2.5)

## 2021-11-26 MED ORDER — MAGNESIUM OXIDE 400 MG PO TABS: 400.0000 mg | ORAL_TABLET | Freq: Four times a day (QID) | ORAL | 1 refills | Status: DC

## 2021-11-26 NOTE — Progress Notes (Signed)
Spoke with Ryan Daniel to discussed Mg result.  He states that since Atenolol was decreased he has felt more energetic,fatigue has resolved. His BP has been well controlled. He denies having any abdominal pain, diarrhea, nausea, vomiting. He drinks alcohol daily, 2 shots of bourbon. Here reporting long history of hypomagnesemia, even when he was in inpt rehab about 3 years ago he had hypoK+ and hypoMg++. No known FHx of hypomg. He take Omeprazole 20 mg tab 1/2 tab daily.  Because he is asymptomatic, he can go ahead and increase Mg oxide from 3 tabs to 4 tabs. Clearly instructed about warning signs. We will re-check Monday and if still low we will need to arrange IV Mg replacement. He voices understanding and agrees with plan.  Orders Placed This Encounter  Procedures   Magnesium   Basic metabolic panel   Parathyroid hormone, intact (no Ca)   Magnesium, urine   Ryan Carreno Martinique, MD

## 2021-11-26 NOTE — Telephone Encounter (Signed)
CRITICAL VALUE STICKER  CRITICAL VALUE: Magnesium 0.8  RECEIVER (on-site recipient of call): Judson Roch, CMA  DATE & TIME NOTIFIED: 11/26/21 438PM  MESSENGER (representative from lab): [facility]  MD NOTIFIED: yes  TIME OF NOTIFICATION: 11/26/21 445PM  RESPONSE: via inbasket.

## 2021-11-26 NOTE — Telephone Encounter (Signed)
He needs to increase Mg Oxide dose, if taking 2 tabs daily he can take 3 tabs. If taking 3 he can take 4 tabs daily (1 tab qid). If having any palpitation, numbness,tingling, fasciculations,or weakness he needs to go to the ER. We need to re-check Mg in 1 week. Thanks, BJ

## 2021-11-26 NOTE — Telephone Encounter (Signed)
Informed patient of results and patient verbalized understanding. He is taking 3 tabs daily, he will start taking 4 tabs daily. He will come for repeat blood work next Wednesday.

## 2021-11-27 LAB — PROTEIN / CREATININE RATIO, URINE
Creatinine, Urine: 145 mg/dL (ref 20–320)
Protein/Creat Ratio: 2076 mg/g creat — ABNORMAL HIGH (ref 25–148)
Protein/Creatinine Ratio: 2.076 mg/mg creat — ABNORMAL HIGH (ref 0.025–0.148)
Total Protein, Urine: 301 mg/dL — ABNORMAL HIGH (ref 5–25)

## 2021-11-30 NOTE — Addendum Note (Signed)
Addended by: Martinique, Charma Mocarski G on: 11/30/2021 07:50 PM   Modules accepted: Orders

## 2021-12-01 ENCOUNTER — Other Ambulatory Visit (INDEPENDENT_AMBULATORY_CARE_PROVIDER_SITE_OTHER): Payer: Medicare HMO

## 2021-12-01 DIAGNOSIS — R809 Proteinuria, unspecified: Secondary | ICD-10-CM | POA: Diagnosis not present

## 2021-12-01 LAB — BASIC METABOLIC PANEL
BUN: 25 mg/dL — ABNORMAL HIGH (ref 6–23)
CO2: 30 mEq/L (ref 19–32)
Calcium: 9.2 mg/dL (ref 8.4–10.5)
Chloride: 99 mEq/L (ref 96–112)
Creatinine, Ser: 1.63 mg/dL — ABNORMAL HIGH (ref 0.40–1.50)
GFR: 42.62 mL/min — ABNORMAL LOW (ref >60.00)
Glucose, Bld: 130 mg/dL — ABNORMAL HIGH (ref 70–99)
Potassium: 4.4 mEq/L (ref 3.5–5.1)
Sodium: 138 mEq/L (ref 135–145)

## 2021-12-01 LAB — MAGNESIUM: Magnesium: 1.3 mg/dL — ABNORMAL LOW (ref 1.5–2.5)

## 2021-12-01 LAB — LIPASE: Lipase: 13 U/L (ref 11.0–59.0)

## 2021-12-03 ENCOUNTER — Other Ambulatory Visit: Payer: Medicare HMO

## 2021-12-03 ENCOUNTER — Other Ambulatory Visit: Payer: Self-pay | Admitting: Family Medicine

## 2021-12-03 DIAGNOSIS — I10 Essential (primary) hypertension: Secondary | ICD-10-CM

## 2021-12-03 LAB — PROTEIN ELECTROPHORESIS,RANDOM URN
Albumin: 74 %
Alpha-1-Globulin, U: 6 %
Alpha-2-Globulin, U: 5 %
Beta Globulin, U: 8 %
Creatinine, Urine: 114 mg/dL (ref 20–320)
Gamma Globulin, U: 7 %
Protein/Creat Ratio: 2509 mg/g creat — ABNORMAL HIGH (ref 25–148)
Protein/Creatinine Ratio: 2.509 mg/mg creat — ABNORMAL HIGH (ref 0.025–0.148)
Total Protein, Urine: 286 mg/dL — ABNORMAL HIGH (ref 5–25)

## 2021-12-03 LAB — PARATHYROID HORMONE, INTACT (NO CA): PTH: 25 pg/mL (ref 16–77)

## 2021-12-04 LAB — MAGNESIUM, URINE: Magnesium, U: <1.4 mg/dL

## 2021-12-12 DIAGNOSIS — N1831 Chronic kidney disease, stage 3a: Secondary | ICD-10-CM | POA: Diagnosis not present

## 2021-12-12 DIAGNOSIS — N1832 Chronic kidney disease, stage 3b: Secondary | ICD-10-CM | POA: Diagnosis not present

## 2021-12-12 DIAGNOSIS — Z113 Encounter for screening for infections with a predominantly sexual mode of transmission: Secondary | ICD-10-CM | POA: Diagnosis not present

## 2021-12-12 DIAGNOSIS — R809 Proteinuria, unspecified: Secondary | ICD-10-CM | POA: Diagnosis not present

## 2021-12-12 DIAGNOSIS — I129 Hypertensive chronic kidney disease with stage 1 through stage 4 chronic kidney disease, or unspecified chronic kidney disease: Secondary | ICD-10-CM | POA: Diagnosis not present

## 2021-12-12 DIAGNOSIS — E785 Hyperlipidemia, unspecified: Secondary | ICD-10-CM | POA: Diagnosis not present

## 2021-12-15 ENCOUNTER — Other Ambulatory Visit: Payer: Self-pay | Admitting: Internal Medicine

## 2021-12-15 DIAGNOSIS — N1831 Chronic kidney disease, stage 3a: Secondary | ICD-10-CM

## 2021-12-18 ENCOUNTER — Ambulatory Visit
Admission: RE | Admit: 2021-12-18 | Discharge: 2021-12-18 | Disposition: A | Discharge status: Home / Self Care | Payer: Medicare HMO | Source: Ambulatory Visit | Attending: Internal Medicine | Admitting: Internal Medicine

## 2021-12-18 DIAGNOSIS — N189 Chronic kidney disease, unspecified: Secondary | ICD-10-CM | POA: Diagnosis not present

## 2021-12-18 DIAGNOSIS — N281 Cyst of kidney, acquired: Secondary | ICD-10-CM | POA: Diagnosis not present

## 2021-12-18 DIAGNOSIS — N1831 Chronic kidney disease, stage 3a: Secondary | ICD-10-CM

## 2022-01-05 ENCOUNTER — Telehealth: Payer: Self-pay | Admitting: Family Medicine

## 2022-01-05 DIAGNOSIS — N1831 Chronic kidney disease, stage 3a: Secondary | ICD-10-CM | POA: Diagnosis not present

## 2022-01-05 NOTE — Telephone Encounter (Signed)
Error/njr °

## 2022-01-06 LAB — CBC AND DIFFERENTIAL
HCT: 43 (ref 41–53)
Hemoglobin: 14.7 (ref 13.5–17.5)
Platelets: 272 10*3/uL (ref 150–400)
WBC: 8.2

## 2022-01-06 LAB — COMPREHENSIVE METABOLIC PANEL WITH GFR
Albumin: 4.3 (ref 3.5–5.0)
Calcium: 10 (ref 8.7–10.7)
eGFR: 45

## 2022-01-06 LAB — CBC: RBC: 4.67 (ref 3.87–5.11)

## 2022-01-06 LAB — BASIC METABOLIC PANEL
BUN: 27 — AB (ref 4–21)
CO2: 25 — AB (ref 13–22)
Chloride: 97 — AB (ref 99–108)
Creatinine: 1.6 — AB (ref 0.6–1.3)
Glucose: 90
Potassium: 5.1 mEq/L (ref 3.5–5.1)
Sodium: 138 (ref 137–147)

## 2022-01-09 ENCOUNTER — Telehealth: Payer: Self-pay | Admitting: Acute Care

## 2022-01-09 NOTE — Telephone Encounter (Signed)
Contacted patient regarding [facility] referral.  Patient  states he is not interested and asked that we close referral and notify his referring provider that he declined to participate in lung cancer screening.  Referral closed and note routed to provider.  ?

## 2022-01-12 DIAGNOSIS — N1832 Chronic kidney disease, stage 3b: Secondary | ICD-10-CM | POA: Diagnosis not present

## 2022-01-12 DIAGNOSIS — E785 Hyperlipidemia, unspecified: Secondary | ICD-10-CM | POA: Diagnosis not present

## 2022-01-12 DIAGNOSIS — R809 Proteinuria, unspecified: Secondary | ICD-10-CM | POA: Diagnosis not present

## 2022-01-12 DIAGNOSIS — I129 Hypertensive chronic kidney disease with stage 1 through stage 4 chronic kidney disease, or unspecified chronic kidney disease: Secondary | ICD-10-CM | POA: Diagnosis not present

## 2022-01-13 ENCOUNTER — Encounter: Payer: Self-pay | Admitting: Family Medicine

## 2022-01-21 DIAGNOSIS — N1831 Chronic kidney disease, stage 3a: Secondary | ICD-10-CM | POA: Diagnosis not present

## 2022-01-26 DIAGNOSIS — E875 Hyperkalemia: Secondary | ICD-10-CM | POA: Diagnosis not present

## 2022-01-30 ENCOUNTER — Other Ambulatory Visit: Payer: Self-pay | Admitting: Family Medicine

## 2022-04-08 DIAGNOSIS — N1832 Chronic kidney disease, stage 3b: Secondary | ICD-10-CM | POA: Diagnosis not present

## 2022-04-17 DIAGNOSIS — R809 Proteinuria, unspecified: Secondary | ICD-10-CM | POA: Diagnosis not present

## 2022-04-17 DIAGNOSIS — E785 Hyperlipidemia, unspecified: Secondary | ICD-10-CM | POA: Diagnosis not present

## 2022-04-17 DIAGNOSIS — I129 Hypertensive chronic kidney disease with stage 1 through stage 4 chronic kidney disease, or unspecified chronic kidney disease: Secondary | ICD-10-CM | POA: Diagnosis not present

## 2022-04-17 DIAGNOSIS — N1832 Chronic kidney disease, stage 3b: Secondary | ICD-10-CM | POA: Diagnosis not present

## 2022-04-20 LAB — BASIC METABOLIC PANEL
BUN: 23 — AB (ref 4–21)
CO2: 29 — AB (ref 13–22)
Chloride: 101 (ref 99–108)
Creatinine: 1.5 — AB (ref 0.6–1.3)
Glucose: 79
Potassium: 4.8 mEq/L (ref 3.5–5.1)
Sodium: 138 (ref 137–147)

## 2022-04-20 LAB — COMPREHENSIVE METABOLIC PANEL
Albumin: 4.1 (ref 3.5–5.0)
Calcium: 9.6 (ref 8.7–10.7)

## 2022-04-22 ENCOUNTER — Encounter: Payer: Self-pay | Admitting: Family Medicine

## 2022-04-29 DIAGNOSIS — R69 Illness, unspecified: Secondary | ICD-10-CM | POA: Diagnosis not present

## 2022-06-16 DIAGNOSIS — N1832 Chronic kidney disease, stage 3b: Secondary | ICD-10-CM | POA: Diagnosis not present

## 2022-06-24 DIAGNOSIS — I129 Hypertensive chronic kidney disease with stage 1 through stage 4 chronic kidney disease, or unspecified chronic kidney disease: Secondary | ICD-10-CM | POA: Diagnosis not present

## 2022-06-24 DIAGNOSIS — E875 Hyperkalemia: Secondary | ICD-10-CM | POA: Diagnosis not present

## 2022-06-24 DIAGNOSIS — E669 Obesity, unspecified: Secondary | ICD-10-CM | POA: Diagnosis not present

## 2022-06-24 DIAGNOSIS — R809 Proteinuria, unspecified: Secondary | ICD-10-CM | POA: Diagnosis not present

## 2022-06-24 DIAGNOSIS — N1832 Chronic kidney disease, stage 3b: Secondary | ICD-10-CM | POA: Diagnosis not present

## 2022-06-24 DIAGNOSIS — E785 Hyperlipidemia, unspecified: Secondary | ICD-10-CM | POA: Diagnosis not present

## 2022-06-25 ENCOUNTER — Encounter: Payer: Self-pay | Admitting: Family Medicine

## 2022-06-25 ENCOUNTER — Other Ambulatory Visit: Payer: Self-pay | Admitting: Family Medicine

## 2022-07-14 ENCOUNTER — Ambulatory Visit: Payer: Medicare HMO | Admitting: Family Medicine

## 2022-07-21 NOTE — Progress Notes (Signed)
HPI: Ryan Daniel is a 71 y.o. male with medical hx significant for hypoMg++,HLD,GERD,HTN,B12 def,and depression here today to discuss sleep apnea.  He was last seen on 11/18/21. His girlfriend has noted episodes of sleep apnea. While visiting his girlfriend's mother, who is with hospice, he slept on the couch and the hospice nurse noted very loud snoring.  He has had some fatigue for a while, he feels rested sometimes when he gets up. No headaches.  Depression on Zoloft 50 mg daily. He has been on same medication for many years.     07/22/2022   10:50 AM 11/18/2021    7:55 AM 11/17/2021    4:13 PM 11/22/2020   10:45 AM 11/20/2020    9:34 AM  Depression screen PHQ 2/9  Decreased Interest 0 1 0 0 0  Down, Depressed, Hopeless 0 1 0 0 0  PHQ - 2 Score 0 2 0 0 0  Altered sleeping 0 0  0 0  Tired, decreased energy 0 2  0 0  Change in appetite 0 0  0 0  Feeling bad or failure about yourself  0 0  0 0  Trouble concentrating 0 0  0 0  Moving slowly or fidgety/restless 0 0  0 0  Suicidal thoughts 0 0  0 0  PHQ-9 Score 0 4  0 0  Difficult doing work/chores Not difficult at all   Not difficult at all Not difficult at all   Hypertension:  Medications:Atenolol 50 mg daily. He is not longer on Lisinopril. Negative for visual changes, exertional chest pain, dyspnea,  focal weakness, or edema. CKD IV, he follows with nephrologist.  Lab Results  Component Value Date   CREATININE 1.5 (A) 04/20/2022   BUN 23 (A) 04/20/2022   NA 138 04/20/2022   K 4.8 04/20/2022   CL 101 04/20/2022   CO2 29 (A) 04/20/2022   Review of Systems  Constitutional:  Negative for activity change, appetite change and fever.  HENT:  Negative for nosebleeds, sore throat and trouble swallowing.   Respiratory:  Negative for cough and wheezing.   Gastrointestinal:  Negative for abdominal pain, nausea and vomiting.  Genitourinary:  Negative for decreased urine volume, dysuria and hematuria.  Neurological:  Negative  for syncope and facial asymmetry.  Rest see pertinent positives and negatives per HPI.  Current Outpatient Medications on File Prior to Visit  Medication Sig Dispense Refill   atenolol (TENORMIN) 50 MG tablet Take 50 mg by mouth daily.     rosuvastatin (CRESTOR) 20 MG tablet Take 1 tablet (20 mg total) by mouth daily. 90 tablet 3   sertraline (ZOLOFT) 50 MG tablet TAKE 1 TABLET EVERY DAY 90 tablet 3   SYMBICORT 160-4.5 MCG/ACT inhaler INHALE 2 PUFFS TWICE DAILY 3 each 0   vitamin B-12 (CYANOCOBALAMIN) 1000 MCG tablet Take 1 tablet (1,000 mcg total) by mouth daily.     No current facility-administered medications on file prior to visit.   Past Medical History:  Diagnosis Date   DEPRESSION 02/05/2010   GERD 02/05/2010   HYPERTENSION 02/05/2010   TOBACCO ABUSE 09/15/2010   Allergies  Allergen Reactions   Oxycodone Nausea And Vomiting   Amlodipine    Prochlorperazine Edisylate     Social History   Socioeconomic History   Marital status: Single    Spouse name: Not on file   Number of children: Not on file   Years of education: Not on file   Highest education level: Some college, no degree  Occupational History   Not on file  Tobacco Use   Smoking status: Every Day    Packs/day: 1.00    Years: 45.00    Total pack years: 45.00    Types: Cigarettes, Cigars    Start date: 01/17/1969    Last attempt to quit: 11/01/2018    Years since quitting: 3.7   Smokeless tobacco: Never   Tobacco comments:    1 cigar daily for the past year.  Vaping Use   Vaping Use: Never used  Substance and Sexual Activity   Alcohol use: Yes    Alcohol/week: 7.0 standard drinks of alcohol    Types: 7 Standard drinks or equivalent per week   Drug use: No   Sexual activity: Yes    Partners: Female  Other Topics Concern   Not on file  Social History Narrative   Not on file   Social Determinants of Health   Financial Resource Strain: Low Risk  (07/21/2022)   Overall Financial Resource Strain  (CARDIA)    Difficulty of Paying Living Expenses: Not hard at all  Food Insecurity: No Food Insecurity (07/21/2022)   Hunger Vital Sign    Worried About Running Out of Food in the Last Year: Never true    [place] in the Last Year: Never true  Transportation Needs: No Transportation Needs (07/21/2022)   PRAPARE - Hydrologist (Medical): No    Lack of Transportation (Non-Medical): No  Physical Activity: Insufficiently Active (07/21/2022)   Exercise Vital Sign    Days of Exercise per Week: 1 day    Minutes of Exercise per Session: 20 min  Stress: No Stress Concern Present (07/21/2022)   [place]    Feeling of Stress : Not at all  Social Connections: Moderately Isolated (07/21/2022)   Social Connection and Isolation Panel [NHANES]    Frequency of Communication with Friends and Family: Once a week    Frequency of Social Gatherings with Friends and Family: Once a week    Attends Religious Services: 1 to 4 times per year    Active Member of [employer] or Organizations: No    Attends Archivist Meetings: Not on file    Marital Status: Living with partner   Vitals:   07/22/22 1039  BP: 134/80  Pulse: 69  Resp: 16  Temp: 98.5 F (36.9 C)  SpO2: 99%  Body mass index is 29.76 kg/m.  Physical Exam Vitals and nursing note reviewed.  Constitutional:      General: He is not in acute distress.    Appearance: He is well-developed and well-groomed.  HENT:     Head: Normocephalic and atraumatic.     Mouth/Throat:     Mouth: Mucous membranes are moist.  Eyes:     Conjunctiva/sclera: Conjunctivae normal.  Cardiovascular:     Rate and Rhythm: Normal rate and regular rhythm.     Heart sounds: No murmur heard.    Comments: DP pulses palpable. Pulmonary:     Effort: Pulmonary effort is normal. No respiratory distress.     Breath sounds: Normal breath sounds.  Abdominal:      Palpations: Abdomen is soft. There is no hepatomegaly or mass.     Tenderness: There is no abdominal tenderness.  Lymphadenopathy:     Cervical: No cervical adenopathy.  Skin:    General: Skin is warm.     Findings: No erythema or rash.  Neurological:  Mental Status: He is alert and oriented to person, place, and time.     Cranial Nerves: No cranial nerve deficit.     Gait: Gait normal.  Psychiatric:        Mood and Affect: Mood and affect normal.   ASSESSMENT AND PLAN:  Mr.Arion was seen today for sleep apnea.  Diagnoses and all orders for this visit: Orders Placed This Encounter  Procedures   Ambulatory referral to Sleep Studies   Ambulatory Referral for Lung Cancer Scre   Sleep apnea, unspecified type We discussed Dx and prognosis. Sleep study will be arranged.  Essential hypertension BP adequately controlled. Continue Atenolol 50 mg daily and low salt diet.  Stage 3b chronic kidney disease (HCC) Stable. Not longer on Lisinopril. Continue adequate hydration and low salt diet as well as avoidance of NSAID's. Follows with nephrologist, last seen on 04/17/22.  Depression, major, recurrent, in partial remission ([place]) Stable. Continue Sertraline 50 mg daily.  Screening for lung cancer -     Ambulatory Referral for Lung Cancer Scre  AAA screening with abdominal CT in 11/2019:The aorta is not enlarged.   Return in about 18 weeks (around 11/25/2022) for cpe.   Jaelynne Hockley G. Martinique, MD  [facility]. [place] office.

## 2022-07-22 ENCOUNTER — Encounter: Payer: Self-pay | Admitting: Family Medicine

## 2022-07-22 ENCOUNTER — Ambulatory Visit (INDEPENDENT_AMBULATORY_CARE_PROVIDER_SITE_OTHER): Payer: Medicare HMO | Admitting: Family Medicine

## 2022-07-22 VITALS — BP 134/80 | HR 69 | Temp 98.5°F | Resp 16 | Ht 70.0 in | Wt 207.4 lb

## 2022-07-22 DIAGNOSIS — N1832 Chronic kidney disease, stage 3b: Secondary | ICD-10-CM | POA: Diagnosis not present

## 2022-07-22 DIAGNOSIS — I10 Essential (primary) hypertension: Secondary | ICD-10-CM | POA: Diagnosis not present

## 2022-07-22 DIAGNOSIS — F3341 Major depressive disorder, recurrent, in partial remission: Secondary | ICD-10-CM | POA: Diagnosis not present

## 2022-07-22 DIAGNOSIS — G4730 Sleep apnea, unspecified: Secondary | ICD-10-CM | POA: Diagnosis not present

## 2022-07-22 DIAGNOSIS — Z122 Encounter for screening for malignant neoplasm of respiratory organs: Secondary | ICD-10-CM

## 2022-07-22 DIAGNOSIS — N183 Chronic kidney disease, stage 3 unspecified: Secondary | ICD-10-CM | POA: Insufficient documentation

## 2022-07-22 NOTE — Patient Instructions (Addendum)
A few things to remember from today's visit:   Sleep apnea, unspecified type - Plan: Ambulatory referral to Sleep Studies  Essential hypertension  Screening for lung cancer - Plan: Ambulatory Referral for Lung Cancer Scre  If you need refills for medications you take chronically, please call your pharmacy. Do not use My Chart to request refills or for acute issues that need immediate attention. If you send a my chart message, it may take a few days to be addressed, specially if I am not in the office.  Please be sure medication list is accurate. If a new problem present, please set up appointment sooner than planned today.  No changes today.

## 2022-08-03 ENCOUNTER — Other Ambulatory Visit: Payer: Self-pay | Admitting: *Deleted

## 2022-08-03 ENCOUNTER — Other Ambulatory Visit: Payer: Self-pay

## 2022-08-03 DIAGNOSIS — Z122 Encounter for screening for malignant neoplasm of respiratory organs: Secondary | ICD-10-CM

## 2022-08-03 DIAGNOSIS — Z87891 Personal history of nicotine dependence: Secondary | ICD-10-CM

## 2022-08-03 DIAGNOSIS — G4730 Sleep apnea, unspecified: Secondary | ICD-10-CM

## 2022-08-11 ENCOUNTER — Encounter: Payer: Self-pay | Admitting: Adult Health

## 2022-08-11 ENCOUNTER — Ambulatory Visit (INDEPENDENT_AMBULATORY_CARE_PROVIDER_SITE_OTHER): Payer: Medicare HMO | Admitting: Adult Health

## 2022-08-11 VITALS — BP 132/70 | HR 100 | Temp 97.7°F | Ht 71.0 in | Wt 212.6 lb

## 2022-08-11 DIAGNOSIS — R400 Somnolence: Secondary | ICD-10-CM

## 2022-08-11 DIAGNOSIS — Z8673 Personal history of transient ischemic attack (TIA), and cerebral infarction without residual deficits: Secondary | ICD-10-CM | POA: Diagnosis not present

## 2022-08-11 DIAGNOSIS — R0683 Snoring: Secondary | ICD-10-CM

## 2022-08-11 NOTE — Assessment & Plan Note (Signed)
Healthy weight loss discussed 

## 2022-08-11 NOTE — Progress Notes (Signed)
Reviewed and agree with assessment/plan.   Chesley Mires, MD [facility] Pulmonary/Critical Care 08/11/2022, 3:16 PM Pager:  [phone]

## 2022-08-11 NOTE — Addendum Note (Signed)
Addended by: Vanessa Barbara on: 08/11/2022 03:26 PM   Modules accepted: Orders

## 2022-08-11 NOTE — Assessment & Plan Note (Signed)
Snoring, restless sleep, daytime sleepiness or concerning for underlying sleep apnea.  Patient has a history of stroke.  Will need an in lab sleep study.  - discussed how weight can impact sleep and risk for sleep disordered breathing - discussed options to assist with weight loss: combination of diet modification, cardiovascular and strength training exercises   - had an extensive discussion regarding the adverse health consequences related to untreated sleep disordered breathing - specifically discussed the risks for hypertension, coronary artery disease, cardiac dysrhythmias, cerebrovascular disease, and diabetes - lifestyle modification discussed   - discussed how sleep disruption can increase risk of accidents, particularly when driving - safe driving practices were discussed   Plan Patient Instructions  Set up for split night sleep study  Healthy sleep regimen  Work on healthy weight loss.  Do not drive if sleepy Follow up in 6 weeks and As needed

## 2022-08-11 NOTE — Patient Instructions (Signed)
Set up for split night sleep study  Healthy sleep regimen  Work on healthy weight loss.  Do not drive if sleepy Follow up in 6 weeks and As needed

## 2022-08-11 NOTE — Progress Notes (Signed)
$'[name]$  ID: Ryan Daniel, male    DOB: Apr 14, 1951, 71 y.o.   MRN: [id number]  Chief Complaint  Patient presents with   Consult    Referring provider: Martinique, Betty G, MD  HPI: 71 year old male seen for sleep consult August 11, 2022 for snoring, restless sleep and daytime sleepiness Medical history of CKD, Hyperlipidemia , Hypertension   TEST/EVENTS :   08/11/2022 Sleep consult  Patient presents for sleep consult today.  Kindly referred by primary care provider Dr. Martinique.  Patient complains of ongoing snoring, restless sleep and daytime sleepiness.  Typically goes to bed about 10:30 PM.  Only takes 10 minutes to go to sleep.  Is up a few times throughout the night.  And gets up about 8 AM.  Patient's weight is down about 10 pounds over the last 2 years.   Patient has never had a sleep study previously.  Caffeine intake 2 cups. Currently 212lbs.  No symptoms suspicious for cataplexy or sleep paralysis. Naps x 2 during daytime  Epworth score is 4 out of 24 . Typically gets sleeping after eating and in evening.  History of ? Stroke in 2019 with peripheral loss of vision .  No history of CHF.  Dental implants on lower .    Patient has a heavy smoking history with a 40-pack-year history.  Currently smoking.  Discussed smoking cessation.  He participates in the lung cancer screening program.  Has an upcoming CT chest later this month.  SH : Retired Medical sales representative . 58 PY hx of smoking . Fiance.  2 etoh drinks daily .  3 adult childs.  SH : 2017 Cholecystectomy ( complicated post op with perforated colon- colostomy s/p reversal)  multiple surgeries for repair   PMH : GERD , HTN , CKD, Hyperlipidemia, Depression   FH: CAD    Allergies  Allergen Reactions   Oxycodone Nausea And Vomiting   Amlodipine    Prochlorperazine Edisylate     Immunization History  Administered Date(s) Administered   Pneumococcal Conjugate-13 02/23/2017    Past Medical History:  Diagnosis Date   DEPRESSION  02/05/2010   GERD 02/05/2010   HYPERTENSION 02/05/2010   TOBACCO ABUSE 09/15/2010    Tobacco History: Social History   Tobacco Use  Smoking Status Every Day   Packs/day: 1.00   Years: 45.00   Total pack years: 45.00   Types: Cigarettes, Cigars   Start date: 01/17/1969   Last attempt to quit: 11/01/2018   Years since quitting: 3.7  Smokeless Tobacco Never  Tobacco Comments   1 cigar daily for the past year.   Ready to quit: Not Answered Counseling given: Not Answered Tobacco comments: 1 cigar daily for the past year.   Outpatient Medications Prior to Visit  Medication Sig Dispense Refill   aspirin EC (ASPIR-LOW) 81 MG tablet      atenolol (TENORMIN) 50 MG tablet Take 50 mg by mouth daily.     dapagliflozin propanediol (FARXIGA) 10 MG TABS tablet      famotidine (PEPCID) 20 MG tablet      rosuvastatin (CRESTOR) 20 MG tablet Take 1 tablet (20 mg total) by mouth daily. 90 tablet 3   sertraline (ZOLOFT) 50 MG tablet TAKE 1 TABLET EVERY DAY 90 tablet 3   SYMBICORT 160-4.5 MCG/ACT inhaler INHALE 2 PUFFS TWICE DAILY 3 each 0   vitamin B-12 (CYANOCOBALAMIN) 1000 MCG tablet Take 1 tablet (1,000 mcg total) by mouth daily.     No facility-administered medications prior to visit.  Review of Systems:   Constitutional:   No  weight loss, night sweats,  Fevers, chills, fatigue, or  lassitude.  HEENT:   No headaches,  Difficulty swallowing,  Tooth/dental problems, or  Sore throat,                No sneezing, itching, ear ache, nasal congestion, post nasal drip,   CV:  No chest pain,  Orthopnea, PND, swelling in lower extremities, anasarca, dizziness, palpitations, syncope.   GI  No heartburn, indigestion, abdominal pain, nausea, vomiting, diarrhea, change in bowel habits, loss of appetite, bloody stools.   Resp: No shortness of breath with exertion or at rest.  No excess mucus, no productive cough,  No non-productive cough,  No coughing up of blood.  No change in color of mucus.  No  wheezing.  No chest wall deformity  Skin: no rash or lesions.  GU: no dysuria, change in color of urine, no urgency or frequency.  No flank pain, no hematuria   MS:  No joint pain or swelling.  No decreased range of motion.  No back pain.    Physical Exam  BP 132/70 (BP Location: Left Arm, Patient Position: Sitting, Cuff Size: Normal)   Pulse 100   Temp 97.7 F (36.5 C) (Oral)   Ht '5\' 11"'$  (1.803 m)   Wt 170 lb (77.1 kg)   SpO2 95%   BMI 23.71 kg/m   GEN: A/Ox3; pleasant , NAD, well nourished    HEENT:  [place]/AT,   NOSE-clear, THROAT-clear, no lesions, no postnasal drip or exudate noted. Class 2-3 MP airway   NECK:  Supple w/ fair ROM; no JVD; normal carotid impulses w/o bruits; no thyromegaly or nodules palpated; no lymphadenopathy.    RESP  Clear  P & A; w/o, wheezes/ rales/ or rhonchi. no accessory muscle use, no dullness to percussion  CARD:  RRR, no m/r/g, no peripheral edema, pulses intact, no cyanosis or clubbing.  GI:   Soft & nt; nml bowel sounds; no organomegaly or masses detected.   Musco: Warm bil, no deformities or joint swelling noted.   Neuro: alert, no focal deficits noted.    Skin: Warm, no lesions or rashes    Lab Results:  CBC   BNP No results found for: "BNP"  ProBNP   Imaging: No results found.        No data to display          No results found for: "NITRICOXIDE"      Assessment & Plan:   Snoring Snoring, restless sleep, daytime sleepiness or concerning for underlying sleep apnea.  Patient has a history of stroke.  Will need an in lab sleep study.  - discussed how weight can impact sleep and risk for sleep disordered breathing - discussed options to assist with weight loss: combination of diet modification, cardiovascular and strength training exercises   - had an extensive discussion regarding the adverse health consequences related to untreated sleep disordered breathing - specifically discussed the risks for  hypertension, coronary artery disease, cardiac dysrhythmias, cerebrovascular disease, and diabetes - lifestyle modification discussed   - discussed how sleep disruption can increase risk of accidents, particularly when driving - safe driving practices were discussed   Plan Patient Instructions  Set up for split night sleep study  Healthy sleep regimen  Work on healthy weight loss.  Do not drive if sleepy Follow up in 6 weeks and As needed         Morbid obesity (  [place]) Healthy weight loss discussed     Rexene Edison, NP 08/11/2022

## 2022-09-02 ENCOUNTER — Encounter: Payer: Self-pay | Admitting: Acute Care

## 2022-09-02 ENCOUNTER — Ambulatory Visit (INDEPENDENT_AMBULATORY_CARE_PROVIDER_SITE_OTHER): Payer: Medicare HMO | Admitting: Acute Care

## 2022-09-02 ENCOUNTER — Ambulatory Visit (INDEPENDENT_AMBULATORY_CARE_PROVIDER_SITE_OTHER): Payer: Medicare HMO

## 2022-09-02 DIAGNOSIS — Z122 Encounter for screening for malignant neoplasm of respiratory organs: Secondary | ICD-10-CM

## 2022-09-02 DIAGNOSIS — Z87891 Personal history of nicotine dependence: Secondary | ICD-10-CM | POA: Diagnosis not present

## 2022-09-02 NOTE — Progress Notes (Signed)
Virtual Visit via Telephone Note  I connected with Gregori Demartin on 09/02/22 at 11:00 AM EDT by telephone and verified that I am speaking with the correct person using two identifiers.  Location: Patient:  At home Provider: [place], [place], [place], [address]    I discussed the limitations, risks, security and privacy concerns of performing an evaluation and management service by telephone and the availability of in person appointments. I also discussed with the patient that there may be a patient responsible charge related to this service. The patient expressed understanding and agreed to proceed.    Shared Decision Making Visit Lung Cancer Screening Program [phone])   Eligibility: Age 71 y.o. Pack Years Smoking History Calculation 52 pack year smoking history (# packs/per year x # years smoked) Recent History of coughing up blood  no Unexplained weight loss? no ( >Than 15 pounds within the last 6 months ) Prior History Lung / other cancer no (Diagnosis within the last 5 years already requiring surveillance chest CT Scans). Smoking Status Former Smoker Former Smokers: Years since quit: 3 years  Quit Date: 2019  Visit Components: Discussion included one or more decision making aids. yes Discussion included risk/benefits of screening. yes Discussion included potential follow up diagnostic testing for abnormal scans. yes Discussion included meaning and risk of over diagnosis. yes Discussion included meaning and risk of False Positives. yes Discussion included meaning of total radiation exposure. yes  Counseling Included: Importance of adherence to annual lung cancer LDCT screening. yes Impact of comorbidities on ability to participate in the program. yes Ability and willingness to under diagnostic treatment. yes  Smoking Cessation Counseling: Current Smokers:  Discussed importance of smoking cessation. yes Information about tobacco cessation classes and  interventions provided to patient. yes Patient provided with "ticket" for LDCT Scan. yes Symptomatic Patient. no  Counseling NA Diagnosis Code: Tobacco Use Z72.0 Asymptomatic Patient yes  Counseling (Intermediate counseling: > three minutes counseling) A1937 Former Smokers:  Discussed the importance of maintaining cigarette abstinence. yes Diagnosis Code: Personal History of Nicotine Dependence. T02.409 Information about tobacco cessation classes and interventions provided to patient. Yes Patient provided with "ticket" for LDCT Scan. yes Written Order for Lung Cancer Screening with LDCT placed in Epic. Yes (CT Chest Lung Cancer Screening Low Dose W/O CM) [id number] Z12.2-Screening of respiratory organs Z87.891-Personal history of nicotine dependence  I spent 25 minutes of face to face time/virtual visit time  with  Mr. Mollenkopf discussing the risks and benefits of lung cancer screening. We took the time to pause the power point at intervals to allow for questions to be asked and answered to ensure understanding. We discussed that he had taken the single most powerful action possible to decrease his risk of developing lung cancer when he quit smoking. I counseled him to remain smoke free, and to contact me if he ever had the desire to smoke again so that I can provide resources and tools to help support the effort to remain smoke free. We discussed the time and location of the scan, and that either  Doroteo Glassman RN, Joella Prince, RN or I  or I will call / send a letter with the results within  24-72 hours of receiving them. He has the office contact information in the event he needs to speak with me,  he verbalized understanding of all of the above and had no further questions upon leaving the office.     I explained to the patient that there has been  a high incidence of coronary artery disease noted on these exams. I explained that this is a non-gated exam therefore degree or severity cannot be  determined. This patient is on statin therapy. I have asked the patient to follow-up with their PCP regarding any incidental finding of coronary artery disease and management with diet or medication as they feel is clinically indicated. The patient verbalized understanding of the above and had no further questions.     Magdalen Spatz, NP 09/02/2022

## 2022-09-02 NOTE — Patient Instructions (Signed)
Thank you for participating in the [facility] Scre[facility]. It was our pleasure to meet you today. We will call you with the results of your scan within the next few days. Your scan will be assigned a Lung RADS category score by the physicians reading the scans.  This Lung RADS score determines follow up scanning.  See below for description of categories, and follow up screening recommendations. We will be in touch to schedule your follow up screening annually or based on recommendations of our providers. We will fax a copy of your scan results to your Primary Care Physician, or the physician who referred you to the program, to ensure they have the results. Please call the office if you have any questions or concerns regarding your scanning experience or results.  Our office number is [phone]. Please speak with Denise Phelps, RN. , or  Denise Buckner RN, They are  our Lung Cancer Screening RN.'s If They are unavailable when you call, Please leave a message on the voice mail. We will return your call at our earliest convenience.This voice mail is monitored several times a day.  Remember, if your scan is normal, we will scan you annually as long as you continue to meet the criteria for the program. (Age 55-77, Current smoker or smoker who has quit within the last 15 years). If you are a smoker, remember, quitting is the single most powerful action that you can take to decrease your risk of lung cancer and other pulmonary, breathing related problems. We know quitting is hard, and we are here to help.  Please let us know if there is anything we can do to help you meet your goal of quitting. If you are a former smoker, congratulations. We are proud of you! Remain smoke free! Remember you can refer friends or family members through the number above.  We will screen them to make sure they meet criteria for the program. Thank you for helping us take better care of you by  participating in Lung Screening.  You can receive free nicotine replacement therapy ( patches, gum or mints) by calling 1-800-QUIT NOW. Please call so we can get you on the path to becoming  a non-smoker. I know it is hard, but you can do this!  Lung RADS Categories:  Lung RADS 1: no nodules or definitely non-concerning nodules.  Recommendation is for a repeat annual scan in 12 months.  Lung RADS 2:  nodules that are non-concerning in appearance and behavior with a very low likelihood of becoming an active cancer. Recommendation is for a repeat annual scan in 12 months.  Lung RADS 3: nodules that are probably non-concerning , includes nodules with a low likelihood of becoming an active cancer.  Recommendation is for a 6-month repeat screening scan. Often noted after an upper respiratory illness. We will be in touch to make sure you have no questions, and to schedule your 6-month scan.  Lung RADS 4 A: nodules with concerning findings, recommendation is most often for a follow up scan in 3 months or additional testing based on our provider's assessment of the scan. We will be in touch to make sure you have no questions and to schedule the recommended 3 month follow up scan.  Lung RADS 4 B:  indicates findings that are concerning. We will be in touch with you to schedule additional diagnostic testing based on our provider's  assessment of the scan.  Other options for assistance in smoking cessation (   As covered by your insurance benefits)  Hypnosis for smoking cessation  [facility] [employer] [phone]  Acupuncture for smoking cessation  [facility] [phone]   

## 2022-09-06 ENCOUNTER — Other Ambulatory Visit: Payer: Self-pay | Admitting: Family Medicine

## 2022-09-10 ENCOUNTER — Telehealth: Payer: Self-pay | Admitting: Adult Health

## 2022-09-10 ENCOUNTER — Telehealth: Payer: Self-pay | Admitting: Acute Care

## 2022-09-10 ENCOUNTER — Other Ambulatory Visit: Payer: Self-pay

## 2022-09-10 DIAGNOSIS — R599 Enlarged lymph nodes, unspecified: Secondary | ICD-10-CM

## 2022-09-10 DIAGNOSIS — R9389 Abnormal findings on diagnostic imaging of other specified body structures: Secondary | ICD-10-CM

## 2022-09-10 NOTE — Telephone Encounter (Signed)
Spoke to pt who would like to know if his sleep study can be escalated for an ASAP HST.  Pt has f/u appt sched for 11/14 - is this needed or should it be scheduled after his HST?

## 2022-09-10 NOTE — Telephone Encounter (Signed)
I have called the patient with the results of his low-dose screening CT. His scan was read as a lung RADS 2 no nodules concerning for lung cancer. 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  Differential considerations include enlarged prevascular lymph node or small thymoma.  Recommendation is for a CT chest without contrast in 3 months to reevaluate.  I have told the patient that I will order the CT chest for 3 months, and then he will follow-up with me in the office to review the results of the scan.  Based on imaging at that time we can refer out if needed. Langley Gauss please place order for CT chest without contrast at the end of January 2024. Please let Dr. Neita Garnet know we will be doing a 3 month follow up CT Chest to re-evaluate the mediastinal pre vascular nodule, and that from lung cancer screening perspective scan was normal.  Patient will need a follow-up in the office with me within a week of the 3 month follow up scan  scan to review the results and refer out based on recommendations per radiology.I tried adding Dr. Neita Garnet to the message, but she must not be affiliated with [facility].  Thanks

## 2022-09-11 ENCOUNTER — Other Ambulatory Visit: Payer: Self-pay

## 2022-09-11 DIAGNOSIS — R9389 Abnormal findings on diagnostic imaging of other specified body structures: Secondary | ICD-10-CM

## 2022-09-11 NOTE — Telephone Encounter (Signed)
Spilt night sleep study was ordered, can we check on status of date

## 2022-09-11 NOTE — Telephone Encounter (Signed)
Please advise 

## 2022-09-14 NOTE — Telephone Encounter (Signed)
Yes reschedule office visit for 2 weeks after split-night study so we can make sure we have the results to go over at his office visit

## 2022-09-14 NOTE — Telephone Encounter (Signed)
3 mth Chest CT without contrast was ordered for end of January 2024. Copy of CT report faxed to Dr Betty Martinique with follow up plans included. Will call pt closer the the time of repeat CT to schedule follow up with Eric Form, NP to discuss CT results.

## 2022-09-14 NOTE — Telephone Encounter (Signed)
I have gotten it authorized & scheduled for AP on 10/16/22 & I have made patient aware of appt.

## 2022-09-14 NOTE — Telephone Encounter (Signed)
Message in appointment desk states sent for presert on 10/3.  Sent message to [place] for an update.

## 2022-09-15 NOTE — Telephone Encounter (Signed)
Called and left voicemail for patient that we are needed to cancel his follow up with Tammy on 11/14 since he does not have sleep study yet and he has app with Dr Halford Chessman on 12/8. Told patient to call office back to confirm if we can cancel appt.

## 2022-09-16 NOTE — Telephone Encounter (Signed)
Called patient and left voicemail asking him to call office back to confirm if we can cancel appointment with Tammy on 11/14. Pt has not had sleep study yet and does not need to follow up with tammy yet. Closing encounter since this is attempt 2 to contact patient

## 2022-09-21 NOTE — Telephone Encounter (Signed)
ATC patient, LVM to return message.  Split night sleep study is scheduled for 12/8 and his appointment is for 11/14.  When he calls back, please reschedule a week after split night sleep study.

## 2022-09-22 ENCOUNTER — Ambulatory Visit: Payer: Medicare HMO | Admitting: Adult Health

## 2022-09-29 DIAGNOSIS — E875 Hyperkalemia: Secondary | ICD-10-CM | POA: Diagnosis not present

## 2022-09-29 DIAGNOSIS — E669 Obesity, unspecified: Secondary | ICD-10-CM | POA: Diagnosis not present

## 2022-09-29 DIAGNOSIS — E785 Hyperlipidemia, unspecified: Secondary | ICD-10-CM | POA: Diagnosis not present

## 2022-09-29 DIAGNOSIS — N1832 Chronic kidney disease, stage 3b: Secondary | ICD-10-CM | POA: Diagnosis not present

## 2022-09-29 DIAGNOSIS — R809 Proteinuria, unspecified: Secondary | ICD-10-CM | POA: Diagnosis not present

## 2022-09-29 DIAGNOSIS — I129 Hypertensive chronic kidney disease with stage 1 through stage 4 chronic kidney disease, or unspecified chronic kidney disease: Secondary | ICD-10-CM | POA: Diagnosis not present

## 2022-09-29 LAB — BASIC METABOLIC PANEL
BUN: 24 — AB (ref 4–21)
CO2: 29 — AB (ref 13–22)
Chloride: 100 (ref 99–108)
Creatinine: 1.3 (ref 0.6–1.3)
Glucose: 82
Potassium: 4.1 mEq/L (ref 3.5–5.1)
Sodium: 137 (ref 137–147)

## 2022-09-29 LAB — COMPREHENSIVE METABOLIC PANEL
Albumin: 4.2 (ref 3.5–5.0)
Calcium: 9.6 (ref 8.7–10.7)

## 2022-09-29 LAB — CBC AND DIFFERENTIAL: Hemoglobin: 16.3 (ref 13.5–17.5)

## 2022-10-09 ENCOUNTER — Encounter: Payer: Self-pay | Admitting: Family Medicine

## 2022-10-16 ENCOUNTER — Ambulatory Visit: Payer: Medicare HMO | Attending: Adult Health | Admitting: Pulmonary Disease

## 2022-10-16 DIAGNOSIS — R400 Somnolence: Secondary | ICD-10-CM | POA: Insufficient documentation

## 2022-10-16 DIAGNOSIS — Z8673 Personal history of transient ischemic attack (TIA), and cerebral infarction without residual deficits: Secondary | ICD-10-CM | POA: Diagnosis not present

## 2022-10-16 DIAGNOSIS — G4733 Obstructive sleep apnea (adult) (pediatric): Secondary | ICD-10-CM | POA: Insufficient documentation

## 2022-10-16 DIAGNOSIS — R0683 Snoring: Secondary | ICD-10-CM | POA: Insufficient documentation

## 2022-10-19 ENCOUNTER — Ambulatory Visit: Payer: Medicare HMO | Admitting: Adult Health

## 2022-10-20 ENCOUNTER — Ambulatory Visit: Payer: Medicare HMO | Admitting: Adult Health

## 2022-10-20 ENCOUNTER — Telehealth: Payer: Self-pay | Admitting: Adult Health

## 2022-10-20 DIAGNOSIS — R0683 Snoring: Secondary | ICD-10-CM | POA: Diagnosis not present

## 2022-10-20 NOTE — Telephone Encounter (Signed)
NPSG October 16, 2022 showed severe sleep apnea with AHI at 51.7/hour and SpO2 low at 83%.  Please set up an office visit or virtual visit to discuss sleep study results and treatment plan

## 2022-10-20 NOTE — Procedures (Signed)
     Patient Name: Ryan Daniel, Ryan Daniel Date: 10/16/2022 Gender: Male D.O.B: 1951/03/25 Age (years): 39 Referring Provider: Tammy Parrett Height (inches): 71 Interpreting Physician: Chesley Mires MD, ABSM Weight (lbs): 212 RPSGT: Peak, Robert BMI: 30 MRN: [id number]  CLINICAL INFORMATION Sleep Study Type: NPSG  Indication for sleep study: Snoring, restless sleep, daytime sleepiness with history of stroke.  Epworth Sleepiness Score: 4  SLEEP STUDY TECHNIQUE As per the AASM Manual for the Scoring of Sleep and Associated Events v2.3 (April 2016) with a hypopnea requiring 4% desaturations.  The channels recorded and monitored were frontal, central and occipital EEG, electrooculogram (EOG), submentalis EMG (chin), nasal and oral airflow, thoracic and abdominal wall motion, anterior tibialis EMG, snore microphone, electrocardiogram, and pulse oximetry.  MEDICATIONS Medications self-administered by patient taken the night of the study : N/A  SLEEP ARCHITECTURE The study was initiated at 9:31:00 PM and ended at 5:23:05 AM.  Sleep onset time was 63.3 minutes and the sleep efficiency was 43.0%. The total sleep time was 203 minutes.  Stage REM latency was 349.0 minutes.  The patient spent 35.47% of the night in stage N1 sleep, 63.30% in stage N2 sleep, 0.00% in stage N3 and 1.2% in REM.  Alpha intrusion was absent.  Supine sleep was 0.00%. He had difficulty with sleep initiation and sleep maintenance due to respiratory events.  RESPIRATORY PARAMETERS The overall apnea/hypopnea index (AHI) was 51.7 per hour. There were 36 total apneas, including 36 obstructive, 0 central and 0 mixed apneas. There were 139 hypopneas and 41 RERAs.  The AHI during Stage REM sleep was 96.0 per hour.  AHI while supine was N/A per hour.  The mean oxygen saturation was 89.25%. The minimum SpO2 during sleep was 83.00%.  Moderate snoring was noted during this study. He did not have sufficient sleep  time during the initial portion of the study to qualify for split night protocol.  CARDIAC DATA The 2 lead EKG demonstrated sinus rhythm. The mean heart rate was 65.53 beats per minute. Other EKG findings include: none.  LEG MOVEMENT DATA The total PLMS were 0 with a resulting PLMS index of 0.00. Associated arousal with leg movement index was 0.0 .  IMPRESSIONS - Severe obstructive sleep apnea with an AHI of 51.7 and SpO2 low of 83%  DIAGNOSIS - Obstructive Sleep Apnea (G47.33)  RECOMMENDATIONS - Additional therapies include CPAP, oral appliance, or surgical assessment. - Avoid alcohol, sedatives and other CNS depressants that may worsen sleep apnea and disrupt normal sleep architecture. - Sleep hygiene should be reviewed to assess factors that may improve sleep quality. - Weight management and regular exercise should be initiated or continued if appropriate.  [Electronically signed] 10/20/2022 03:17 PM  Chesley Mires MD, ABSM Diplomate, American Board of Sleep Medicine NPI: [id number]  [place] PH: [phone]   FX: [phone] [place]

## 2022-10-21 NOTE — Telephone Encounter (Signed)
ATC x1.  LVM to return call to schedule virtual visit to review sleep study results and recommendations.  When he returns call.  Please schedule virtual visit for today for 10 am or 11 am. Will await return call.

## 2022-10-28 NOTE — Telephone Encounter (Signed)
Called and spoke with patient, provided results/recommendations per Rexene Edison NP.  He verbalized understanding.  Scheduled for mychart visit with Rexene Edison NP at 11 am on 11/20/2021.  Nothing further needed.

## 2022-11-06 ENCOUNTER — Telehealth: Payer: Self-pay | Admitting: *Deleted

## 2022-11-06 NOTE — Patient Outreach (Signed)
  Care Coordination   11/06/2022 Name: Ryan Daniel MRN: [id number] DOB: 01-04-1951   Care Coordination Outreach Attempts:  An unsuccessful telephone outreach was attempted today to offer the patient information about available care coordination services as a benefit of their health plan.   Follow Up Plan:  Additional outreach attempts will be made to offer the patient care coordination information and services.   Encounter Outcome:  No Answer   Care Coordination Interventions:  No, not indicated    Raina Mina, RN Care Management Coordinator [place] [phone]

## 2022-11-19 ENCOUNTER — Telehealth (INDEPENDENT_AMBULATORY_CARE_PROVIDER_SITE_OTHER): Payer: Medicare HMO | Admitting: Family Medicine

## 2022-11-19 ENCOUNTER — Other Ambulatory Visit: Payer: Self-pay | Admitting: Family Medicine

## 2022-11-19 VITALS — Ht 71.0 in | Wt 205.0 lb

## 2022-11-19 DIAGNOSIS — F3341 Major depressive disorder, recurrent, in partial remission: Secondary | ICD-10-CM

## 2022-11-19 DIAGNOSIS — Z0000 Encounter for general adult medical examination without abnormal findings: Secondary | ICD-10-CM | POA: Diagnosis not present

## 2022-11-19 NOTE — Progress Notes (Signed)
PATIENT CHECK-IN and HEALTH RISK ASSESSMENT QUESTIONNAIRE:  -completed by phone/video for upcoming Medicare Preventive Visit  Pre-Visit Check-in: 1)Vitals (height, wt, BP, etc) - record in vitals section for visit on day of visit 2)Review and Update Medications, Allergies PMH, Surgeries, Social history in Epic 3)Hospitalizations in the last year with date/reason?  4)Review and Update Care Team (patient's specialists) in Epic 5) Complete PHQ9 in Epic  6) Complete Fall Screening in Epic 7)Review all Health Maintenance Due and order under PCP if not done.  Medicare Wellness Patient Questionnaire:  Answer theses question about your habits: Do you drink alcohol? Daily, 2 drinks.  Have you ever smoked? Yes, has not smoked cigarettes in years, smokes 1 cigar a day How many packs a day do/did you smoke? 1ppd in the past - quit at least 5 years ago Do you use smokeless tobacco?  no Do you use an illicit drugs? none Do you exercises? None, walks around the house and to the mailbox - health limits him, hips start hurting Diet: typical diet, eats at home, two meals - light breakfast, snack and then dinner. Eats vegetables - several servings at dinner. Does not eat a lot of fruit. Does eat a lot of broccoli and carrots, rarely eats prepared dinners.   Answer theses question about you: Can you perform most household chores? yes Do you find it hard to follow a conversation in a noisy room?none Do you often ask people to speak up or repeat themselves?none Do you feel that you have a problem with memory? none Do you balance your checkbook and or bank acounts? yes Do you feel safe at home? yes Last dentist visit? Just went last month, goes every 6 months Do you need assistance with any of the following: Please note if so : none  Driving?  Feeding yourself?  Getting from bed to chair?  Getting to the toilet?  Bathing or showering?  Dressing yourself?  Managing money?  Climbing a flight of  stairs  Preparing meals?    Do you have Advanced Directives in place (Living Will, Healthcare Power or Attorney)? No, he is aware and plans to do   Last eye Exam and location? Has not had one recently, got reminder to schedule and he plans to schedule   Do you currently use prescribed or non-prescribed narcotic or opioid pain medications? none   Nurse/Assistant Credentials/time stamp:   ----------------------------------------------------------------------------------------------------------------------------------------------------------------------------------------------------------------------    [name] (Welcome to Medicare, initial annual wellness or annual wellness exam)  Virtual Visit via phone Note  I connected with Ryan Daniel  on 11/19/22 by telephone and verified that I am speaking with the correct person using two identifiers.  Location patient: home Location provider:work or home office Persons participating in the virtual visit: patient, provider  Concerns and/or follow up today: none   See HM section in Epic for other details of completed HM.    ROS: negative for report of fevers, unintentional weight loss, vision changes, vision loss, hearing loss or change, chest pain, sob, hemoptysis, melena, hematochezia, hematuria, genital discharge or lesions, falls, bleeding or bruising, loc, thoughts of suicide or self harm, memory loss  Patient-completed extensive health risk assessment - reviewed and discussed with the patient: See Health Risk Assessment completed with patient prior to the visit either above or in recent phone note. This was reviewed in detailed with the patient today and appropriate recommendations, orders and referrals were placed as needed per Summary below and patient instructions.  Review of Medical History: -PMH, PSH, Family History and current specialty and care providers reviewed and updated and listed  below   Patient Care Team: [place], Betty G, MD as PCP - General (Family Medicine) Ryan Mend, MD as Attending Physician (Nephrology)   Past Medical History:  Diagnosis Date   DEPRESSION 02/05/2010   GERD 02/05/2010   HYPERTENSION 02/05/2010   TOBACCO ABUSE 09/15/2010    No past surgical history on file.  Social History   Socioeconomic History   Marital status: Single    Spouse name: Not on file   Number of children: Not on file   Years of education: Not on file   Highest education level: Some college, no degree  Occupational History   Not on file  Tobacco Use   Smoking status: Every Day    Packs/day: 1.00    Years: 52.00    Total pack years: 52.00    Types: Cigarettes, Cigars    Start date: 01/17/1969    Last attempt to quit: 11/01/2018    Years since quitting: 4.0   Smokeless tobacco: Never   Tobacco comments:    1 cigar daily for the past year.  Vaping Use   Vaping Use: Never used  Substance and Sexual Activity   Alcohol use: Yes    Alcohol/week: 7.0 standard drinks of alcohol    Types: 7 Standard drinks or equivalent per week   Drug use: No   Sexual activity: Yes    Partners: Female  Other Topics Concern   Not on file  Social History Narrative   Not on file   Social Determinants of Health   Financial Resource Strain: Low Risk  (07/21/2022)   Overall Financial Resource Strain (CARDIA)    Difficulty of Paying Living Expenses: Not hard at all  Food Insecurity: No Food Insecurity (07/21/2022)   Hunger Vital Sign    Worried About Running Out of Food in the Last Year: Never true    [name] in the Last Year: Never true  Transportation Needs: No Transportation Needs (07/21/2022)   PRAPARE - Hydrologist (Medical): No    Lack of Transportation (Non-Medical): No  Physical Activity: Insufficiently Active (07/21/2022)   Exercise Vital Sign    Days of Exercise per Week: 1 day    Minutes of Exercise per Session: 20 min   Stress: No Stress Concern Present (07/21/2022)   [name]    Feeling of Stress : Not at all  Social Connections: Moderately Isolated (07/21/2022)   Social Connection and Isolation Panel [NHANES]    Frequency of Communication with Friends and Family: Once a week    Frequency of Social Gatherings with Friends and Family: Once a week    Attends Religious Services: 1 to 4 times per year    Active Member of [employer] or Organizations: No    Attends Music therapist: Not on file    Marital Status: Living with partner  Intimate Partner Violence: Not At Risk (11/22/2020)   Humiliation, Afraid, Rape, and Kick questionnaire    Fear of Current or Ex-Partner: No    Emotionally Abused: No    Physically Abused: No    Sexually Abused: No    Family History  Problem Relation Age of Onset   Cancer Maternal Grandmother        colon   Heart disease Paternal Grandfather     Current Outpatient Medications on  File Prior to Visit  Medication Sig Dispense Refill   aspirin EC (ASPIR-LOW) 81 MG tablet      atenolol (TENORMIN) 50 MG tablet Take 50 mg by mouth daily.     dapagliflozin propanediol (FARXIGA) 10 MG TABS tablet      famotidine (PEPCID) 20 MG tablet      rosuvastatin (CRESTOR) 20 MG tablet TAKE 1 TABLET EVERY DAY 90 tablet 2   sertraline (ZOLOFT) 50 MG tablet TAKE 1 TABLET EVERY DAY 90 tablet 3   SYMBICORT 160-4.5 MCG/ACT inhaler INHALE 2 PUFFS TWICE DAILY 3 each 0   No current facility-administered medications on file prior to visit.    Allergies  Allergen Reactions   Oxycodone Nausea And Vomiting   Amlodipine    Prochlorperazine Edisylate        Physical Exam There were no vitals filed for this visit. Estimated body mass index is 28.59 kg/m as calculated from the following:   Height as of this encounter: '5\' 11"'$  (1.803 m).   Weight as of this encounter: 205 lb (93 kg).  EKG (optional): deferred due to  virtual visit  GENERAL: alert, oriented, no audible sounds of distress, vision exam deferred due to virtual visit.    PSYCH/NEURO: pleasant and cooperative, no obvious depression or anxiety, speech and thought processing grossly intact, Cognitive function grossly intact  [place] Visit from 07/22/2022 in [facility]  PHQ-9 Total Score 0           07/22/2022   10:50 AM 11/18/2021    7:55 AM 11/17/2021    4:13 PM 11/22/2020   10:45 AM 11/20/2020    9:34 AM  Depression screen PHQ 2/9  Decreased Interest 0 1 0 0 0  Down, Depressed, Hopeless 0 1 0 0 0  PHQ - 2 Score 0 2 0 0 0  Altered sleeping 0 0  0 0  Tired, decreased energy 0 2  0 0  Change in appetite 0 0  0 0  Feeling bad or failure about yourself  0 0  0 0  Trouble concentrating 0 0  0 0  Moving slowly or fidgety/restless 0 0  0 0  Suicidal thoughts 0 0  0 0  PHQ-9 Score 0 4  0 0  Difficult doing work/chores Not difficult at all   Not difficult at all Not difficult at all       11/22/2020   10:43 AM 11/17/2021    4:13 PM 07/21/2022    9:57 AM 07/22/2022   10:50 AM 11/19/2022   11:57 AM  Fall Risk  Falls in the past year? 1 0 0 0 0  Was there an injury with Fall? 1   0 0  Was there an injury with Fall? - Comments tripped on speed bump, broke nose      Fall Risk Category Calculator 2   0 0  Fall Risk Category Moderate   Low Low  Patient Fall Risk Level Moderate fall risk Low fall risk  Low fall risk Low fall risk  Patient at Risk for Falls Due to History of fall(s);Impaired vision   Other (Comment) No Fall Risks  Fall risk Follow up Falls evaluation completed;Falls prevention discussed Falls evaluation completed;Education provided;Falls prevention discussed  Falls evaluation completed Falls evaluation completed     SUMMARY AND PLAN:  Medicare annual wellness visit, subsequent    Discussed applicable health maintenance/preventive health measures and advised and referred or ordered per  patient preferences:  Health  Maintenance  Topic Date Due   Pneumonia Vaccine 50+ Years old (2 - PPSV23 or PCV20) 04/20/2017   INFLUENZA VACCINE  Never done   COVID-19 Vaccine (1) 12/06/2023 (Originally 01/17/1956)   Zoster Vaccines- Shingrix (1 of 2) 02/18/2024 (Originally 01/18/1970)   Lung Cancer Screening  09/03/2023   Medicare Annual Wellness (AWV)  11/20/2023   COLONOSCOPY (Pts 45-30yr Insurance coverage will need to be confirmed)  11/30/2027   Hepatitis C Screening  Completed   HPV VACCINES  Aged Out   DTaP/Tdap/Td  Discontinued  Reviewed all vaccines do and options for obtaining. Answered questions.  Discussed prostate cancer screening though out of window for routine screening per FM guidelines. Advised to discuss with PCP and advised would do screening further workup if any LUTS/  He is scheduled for lung cancer screening - reviewed with him.  Education and counseling on the following was provided based on the above review of health and a plan/checklist for the patient, along with additional information discussed, was provided for the patient in the patient instructions :  -Advised on importance of and provided resources for completing advanced directives in pt instructions. -Advised and counseled on maintaining healthy weight and healthy lifestyle - including the importance of a health diet, regular physical activity, social connections and stress management. -Advised and counseled on a whole foods based healthy diet and regular exercise: discussed a heart healthy whole foods based diet at length. A summary of a healthy diet was provided in the Patient Instructions. Recommended regular exercise and discussed options within the community, options when health is making regular exercise options a challenge, advise checking with his PCP prior to any sig change in exercise activiy.  -Advise yearly dental visits at minimum and regular eye exams -Advised and counseled on alcohol, tobacco use,  limits, risks. Congratulated on prior cigarette cessation.  Follow up: see patient instructions   Patient Instructions  I really enjoyed getting to talk with you today! I am available on Tuesdays and Thursdays for virtual visits if you have any questions or concerns, or if I can be of any further assistance.   CHECKLIST FROM ANNUAL WELLNESS VISIT:  -Follow up (please call to schedule if not scheduled after visit):  -Inperson visit with your Primary Doctor office: in next few months if not already scheduled -yearly for annual wellness visit with primary care office  Here is a list of your preventive care/health maintenance measures and the plan for each if any are due:  Health Maintenance  Topic Date Due   Pneumonia Vaccine 72 Years old (2 - PPSV23 or PCV20) Can get at pharmacy if you ever want this or at office.    INFLUENZA VACCINE  Never done. Can get at pharmacy if you ever want this or at office.    COVID-19 Vaccine (1) 12/06/2023 (Originally 01/17/1956) can get at pharmacy if you ever want this.    Zoster Vaccines- Shingrix (1 of 2) 02/18/2024 (Originally 01/18/1970) Can get at pharmacy if you ever want this or at office.    Lung Cancer Screening  Scheduled, please keep appointment   Medicare Annual Wellness (AWV)  11/20/2023   COLONOSCOPY (Pts 45-463yrInsurance coverage will need to be confirmed)  11/30/2027   Hepatitis C Screening  Completed   HPV VACCINES  Aged Out   DTaP/Tdap/Td  Discontinued    -See a dentist at least yearly  -Get your eyes checked and then per your eye specialist's recommendations  -Other issues addressed today: -I have included  below further information regarding a healthy whole foods based diet, physical activity guidelines for adults, stress management and opportunities for social connections. I hope you find this information useful.      NUTRITION: -eat real food: lots of colorful vegetables (half the plate) and fruits -5-7 servings of vegetables and fruits per day (fresh or steamed is best), exp. 2 servings of vegetables with lunch and dinner and 2 servings of fruit per day. Berries and greens such as kale and collards are great choices.  -consume on a regular basis: whole grains (make sure first ingredient on label contains the word "whole"), fresh fruits, fish, nuts, seeds, healthy oils (such as olive oil, avocado oil, grape seed oil) -may eat small amounts of dairy and lean meat on occasion, but avoid processed meats such as ham, bacon, lunch meat, etc. -drink water -try to avoid fast food and pre-packaged foods, processed meat -most experts advise limiting sodium to < '2300mg'$  per day, should limit further is any chronic conditions such as high blood pressure, heart disease, diabetes, etc. The American Heart Association advised that < '1500mg'$  is is ideal -try to avoid foods that contain any ingredients with names you do not recognize  -try to avoid sugar/sweets (except for the natural sugar that occurs in fresh fruit) -try to avoid sweet drinks -try to avoid white rice, white bread, pasta (unless whole grain), white or yellow potatoes  EXERCISE GUIDELINES FOR ADULTS: -if you wish to increase your physical activity, do so gradually and with the approval of your doctor -STOP and seek medical care immediately if you have any chest pain, chest discomfort or trouble breathing when starting or increasing exercise  -move and stretch your body, legs, feet and arms when sitting for long periods -Physical activity guidelines for optimal health in adults: -least 150 minutes per week of  aerobic exercise (can talk, but not sing) once approved by your doctor, 20-30 minutes of sustained activity or two 10 minute episodes of sustained activity every day.  -resistance training at least 2 days per week if approved by your doctor -balance exercises 3+ days per week:   Stand somewhere where you have something sturdy to hold onto if you lose balance.    1) lift up on toes, start with 5x per day and work up to 20x   2) stand and lift on leg straight out to the side so that foot is a few inches of the floor, start with 5x each side and work up to 20x each side   3) stand on one foot, start with 5 seconds each side and work up to 20 seconds on each side  If you need ideas or help with getting more active:  -Silver sneakers https://tools.silversneakers.com  -Walk with a Doc: http://stephens-thompson.biz/  -try to include resistance (weight lifting/strength building) and balance exercises twice per week: or the following link for ideas: ChessContest.fr  UpdateClothing.com.cy  STRESS MANAGEMENT: -can try meditating, or just sitting quietly with deep breathing while intentionally relaxing all parts of your body for 5 minutes daily -if you need further help with stress, anxiety or depression please follow up with your primary doctor or contact the wonderful folks at [place]: [place]: -options in [place] if you wish to engage in more social and exercise related activities:  -Silver sneakers https://tools.silversneakers.com  -Walk with a Doc: http://stephens-thompson.biz/  -Check out the [place] 50+ section on the [place] [employer] (hiking clubs, book clubs, cards and games,  chess, exercise classes, aquatic classes and much more) - see the website for  details: https://www.[place].gov/departments/parks-recreation/active-adults50  -YouTube has lots of exercise videos for different ages and abilities as well  -[place] (a variety of indoor and outdoor inperson activities for adults). [phone]. [address]  -Virtual Online Classes (a variety of topics): see seniorplanet.org or call [phone]  -consider volunteering at a school, hospice center, church, senior center or elsewhere         ADVANCED HEALTHCARE DIRECTIVES:  Everyone should have advanced health care directives in place. This is so that you get the care you want, should you ever be in a situation where you are unable to make your own medical decisions.   From the [place] Advanced Directive Website: "[place] are legal documents in which you give written instructions about your health care if, in the future, you cannot speak for yourself.   A health care power of attorney allows you to name a person you trust to make your health care decisions if you cannot make them yourself. A declaration of a desire for a natural death (or living will) is document, which states that you desire not to have your life prolonged by extraordinary measures if you have a terminal or incurable illness or if you are in a vegetative state. An advance instruction for mental health treatment makes a declaration of instructions, information and preferences regarding your mental health treatment. It also states that you are aware that the advance instruction authorizes a mental health treatment provider to act according to your wishes. It may also outline your consent or refusal of mental health treatment. A declaration of an anatomical gift allows anyone over the age of 102 to make a gift by will, organ donor card or other document."   Please see the following website or an elder law attorney for forms, FAQs and for completion of advanced  directives: [place] Secretary of [place] ([email or web address])  Or copy and paste the following to your web browser: [email or web address]    Lucretia Kern, DO

## 2022-11-19 NOTE — Progress Notes (Signed)
error 

## 2022-11-19 NOTE — Patient Instructions (Addendum)
I really enjoyed getting to talk with you today! I am available on Tuesdays and Thursdays for virtual visits if you have any questions or concerns, or if I can be of any further assistance.   CHECKLIST FROM ANNUAL WELLNESS VISIT:  -Follow up (please call to schedule if not scheduled after visit):  -Inperson visit with your Primary Doctor office: in next few months if not already scheduled -yearly for annual wellness visit with primary care office  Here is a list of your preventive care/health maintenance measures and the plan for each if any are due:  Health Maintenance  Topic Date Due   Pneumonia Vaccine 33+ Years old (2 - PPSV23 or PCV20) Can get at pharmacy if you ever want this or at office.    INFLUENZA VACCINE  Never done. Can get at pharmacy if you ever want this or at office.    COVID-19 Vaccine (1) 12/06/2023 (Originally 01/17/1956) can get at pharmacy if you ever want this.    Zoster Vaccines- Shingrix (1 of 2) 02/18/2024 (Originally 01/18/1970) Can get at pharmacy if you ever want this or at office.    Lung Cancer Screening  Scheduled, please keep appointment   Medicare Annual Wellness (AWV)  11/20/2023   COLONOSCOPY (Pts 45-1yr Insurance coverage will need to be confirmed)  11/30/2027   Hepatitis C Screening  Completed   HPV VACCINES  Aged Out   DTaP/Tdap/Td  Discontinued    -See a dentist at least yearly  -Get your eyes checked and then per your eye specialist's recommendations  -Other issues addressed today: -I have included below further information regarding a healthy whole foods based diet, physical activity guidelines for adults, stress management and opportunities for social connections. I hope you find this information useful.      NUTRITION: -eat real food: lots of colorful vegetables (half the plate) and fruits -5-7 servings of vegetables and fruits per day (fresh or steamed is best), exp. 2 servings of vegetables with lunch and dinner and 2 servings of fruit per day. Berries and greens such as kale and collards are great choices.  -consume on a regular basis: whole grains (make sure first ingredient on label contains the word "whole"), fresh fruits, fish, nuts, seeds, healthy oils (such as olive oil, avocado oil, grape seed oil) -may eat small amounts of dairy and lean meat on occasion, but avoid processed meats such as ham, bacon, lunch meat, etc. -drink water -try to avoid fast food and pre-packaged foods, processed meat -most experts advise limiting sodium to < '2300mg'$  per day, should limit further is any chronic conditions such as high blood pressure, heart disease, diabetes, etc. The American Heart Association advised that < '1500mg'$  is is ideal -try to avoid foods that contain any ingredients with names you do not recognize  -try to avoid sugar/sweets (except for the natural sugar that occurs in fresh fruit) -try to avoid sweet drinks -try to avoid white rice, white bread, pasta (unless whole grain), white or yellow potatoes  EXERCISE GUIDELINES FOR ADULTS: -if you wish to increase your physical activity, do so gradually and with the approval of your doctor -STOP and seek medical care immediately if you have any chest pain, chest discomfort or trouble breathing when starting or increasing exercise  -move and stretch your body, legs, feet and arms when sitting for long periods -Physical activity guidelines for optimal health in adults: -least 150 minutes per week of  aerobic exercise (can talk, but not sing) once approved by your  doctor, 20-30 minutes of sustained activity or two 10 minute episodes of sustained activity every day.  -resistance training at least 2 days per week if approved by your doctor -balance exercises 3+ days per week:   Stand somewhere where you have something sturdy to hold onto if you lose balance.    1) lift up on toes, start with 5x per day and work up to 20x   2) stand and lift on leg straight out to the side so that foot is a few inches of the floor, start with 5x each side and work up to 20x each side   3) stand on one foot, start with 5 seconds each side and work up to 20 seconds on each side  If you need ideas or help with getting more active:  -Silver sneakers https://tools.silversneakers.com  -Walk with a Doc: http://stephens-thompson.biz/  -try to include resistance (weight lifting/strength building) and balance exercises twice per week: or the following link for ideas: ChessContest.fr  UpdateClothing.com.cy  STRESS MANAGEMENT: -can try meditating, or just sitting quietly with deep breathing while intentionally relaxing all parts of your body for 5 minutes daily -if you need further help with stress, anxiety or depression please follow up with your primary doctor or contact the wonderful folks at [place]: [place]: -options in [place] if you wish to engage in more social and exercise related activities:  -Silver sneakers https://tools.silversneakers.com  -Walk with a Doc: http://stephens-thompson.biz/  -Check out the [place] 50+ section on the [place] [employer] (hiking clubs, book clubs, cards and games, chess, exercise classes, aquatic classes and much more) - see the website for  details: https://www.[place].gov/departments/parks-recreation/active-adults50  -YouTube has lots of exercise videos for different ages and abilities as well  -[place] (a variety of indoor and outdoor inperson activities for adults). [phone]. [address]  -Virtual Online Classes (a variety of topics): see seniorplanet.org or call [phone]  -consider volunteering at a school, hospice center, church, senior center or elsewhere         ADVANCED HEALTHCARE DIRECTIVES:  Everyone should have advanced health care directives in place. This is so that you get the care you want, should you ever be in a situation where you are unable to make your own medical decisions.   From the [place] Advanced Directive Website: "[place] are legal documents in which you give written instructions about your health care if, in the future, you cannot speak for yourself.   A health care power of attorney allows you to name a person you trust to make your health care decisions if you cannot make them yourself. A declaration of a desire for a natural death (or living will) is document, which states that you desire not to have your life prolonged by extraordinary measures if you have a terminal or incurable illness or if you are in a vegetative state. An advance instruction for mental health treatment makes a declaration of instructions, information and preferences regarding your mental health treatment. It also states that you are aware that the advance instruction authorizes a mental health treatment provider to act according to your wishes. It may also outline your consent or refusal of mental health treatment. A declaration of an anatomical gift allows anyone over the age of 68 to make a gift by will, organ donor card or other document."   Please see the following website or an elder law attorney for forms, FAQs and for completion  of advanced  directives: [place] [employer] [place] ([email or web address])  Or copy and paste the following to your web browser: [email or web address]

## 2022-11-20 ENCOUNTER — Telehealth (INDEPENDENT_AMBULATORY_CARE_PROVIDER_SITE_OTHER): Payer: Medicare HMO | Admitting: Adult Health

## 2022-11-20 ENCOUNTER — Encounter: Payer: Self-pay | Admitting: Adult Health

## 2022-11-20 DIAGNOSIS — G4733 Obstructive sleep apnea (adult) (pediatric): Secondary | ICD-10-CM

## 2022-11-20 NOTE — Progress Notes (Unsigned)
Virtual Visit via Video Note  I connected with Ryan Daniel on 11/20/22 at 11:00 AM EST by a video enabled telemedicine application and verified that I am speaking with the correct person using two identifiers.  Location: Patient: Home  Provider: Office    I discussed the limitations of evaluation and management by telemedicine and the availability of in person appointments. The patient expressed understanding and agreed to proceed.  History of Present Illness: 72 year old male seen for sleep consult August 11, 2022 for snoring, restless sleep and daytime sleepiness found to have severe sleep apnea Medical history significant for chronic kidney disease, hyperlipidemia and hypertension  Today's video visit is a 11-monthfollow-up.  Patient was seen last visit for a sleep consult for snoring, daytime sleepiness.  He was set up for split-night sleep study this was done on October 16, 2022 that showed severe sleep apnea with a 51.7/hour and SpO2 low at 83%.  Titration portion of the study was not done.  We discussed his sleep study results in detail and went over treatment options with weight loss and CPAP therapy.  Patient is in agreement to begin CPAP therapy.  Patient education was given.  Past Medical History:  Diagnosis Date   DEPRESSION 02/05/2010   GERD 02/05/2010   HYPERTENSION 02/05/2010   TOBACCO ABUSE 09/15/2010   Current Outpatient Medications on File Prior to Visit  Medication Sig Dispense Refill   aspirin EC (ASPIR-LOW) 81 MG tablet      atenolol (TENORMIN) 50 MG tablet Take 50 mg by mouth daily.     dapagliflozin propanediol (FARXIGA) 10 MG TABS tablet      famotidine (PEPCID) 20 MG tablet      fenofibrate (TRICOR) 145 MG tablet Take 145 mg by mouth daily.     rosuvastatin (CRESTOR) 20 MG tablet TAKE 1 TABLET EVERY DAY 90 tablet 2   sertraline (ZOLOFT) 50 MG tablet TAKE 1 TABLET EVERY DAY 90 tablet 3   SYMBICORT 160-4.5 MCG/ACT inhaler INHALE 2 PUFFS TWICE DAILY 3 each 3   No  current facility-administered medications on file prior to visit.      Observations/Objective: Split-night sleep study done October 16, 2022 showed severe sleep apnea AHI 51.7/hour and SpO2 low at 83%.  Assessment and Plan: Severe obstructive sleep apnea.  Will need to begin CPAP therapy immediately. Begin auto CPAP 5 to 15 cm H2O. - discussed how weight can impact sleep and risk for sleep disordered breathing - discussed options to assist with weight loss: combination of diet modification, cardiovascular and strength training exercises   - had an extensive discussion regarding the adverse health consequences related to untreated sleep disordered breathing - specifically discussed the risks for hypertension, coronary artery disease, cardiac dysrhythmias, cerebrovascular disease, and diabetes - lifestyle modification discussed   - discussed how sleep disruption can increase risk of accidents, particularly when driving - safe driving practices were discussed    Plan  Patient Instructions  Begin CPAP at bedtime goal is to wear all night long for at least 6 or more hours Work on healthy weight Do not drive if sleepy Follow-up up in 3 months and as needed   .  Follow Up Instructions:    I discussed the assessment and treatment plan with the patient. The patient was provided an opportunity to ask questions and all were answered. The patient agreed with the plan and demonstrated an understanding of the instructions.   The patient was advised to call back or seek an in-person evaluation  if the symptoms worsen or if the condition fails to improve as anticipated.  I provided 22 minutes of non-face-to-face time during this encounter.   Rexene Edison, NP

## 2022-11-20 NOTE — Patient Instructions (Signed)
Begin CPAP at bedtime goal is to wear all night long for at least 6 or more hours Work on healthy weight Do not drive if sleepy Follow-up up in 3 months and as needed

## 2022-11-23 ENCOUNTER — Ambulatory Visit: Payer: Medicare HMO

## 2022-12-03 ENCOUNTER — Ambulatory Visit (HOSPITAL_COMMUNITY)
Admission: RE | Admit: 2022-12-03 | Discharge: 2022-12-03 | Disposition: A | Discharge status: Home / Self Care | Payer: Medicare HMO | Source: Ambulatory Visit | Attending: Acute Care | Admitting: Acute Care

## 2022-12-03 DIAGNOSIS — J439 Emphysema, unspecified: Secondary | ICD-10-CM | POA: Diagnosis not present

## 2022-12-03 DIAGNOSIS — R9389 Abnormal findings on diagnostic imaging of other specified body structures: Secondary | ICD-10-CM

## 2023-01-22 ENCOUNTER — Other Ambulatory Visit: Payer: Self-pay | Admitting: Family Medicine

## 2023-02-01 ENCOUNTER — Other Ambulatory Visit: Payer: Self-pay | Admitting: Family Medicine

## 2023-02-09 ENCOUNTER — Encounter: Payer: Self-pay | Admitting: Adult Health

## 2023-02-09 ENCOUNTER — Ambulatory Visit: Payer: Medicare HMO | Admitting: Adult Health

## 2023-02-09 VITALS — BP 146/80 | HR 63 | Temp 97.8°F | Ht 70.0 in | Wt 210.0 lb

## 2023-02-09 DIAGNOSIS — G4733 Obstructive sleep apnea (adult) (pediatric): Secondary | ICD-10-CM | POA: Insufficient documentation

## 2023-02-09 DIAGNOSIS — F17200 Nicotine dependence, unspecified, uncomplicated: Secondary | ICD-10-CM

## 2023-02-09 DIAGNOSIS — F17210 Nicotine dependence, cigarettes, uncomplicated: Secondary | ICD-10-CM

## 2023-02-09 NOTE — Progress Notes (Signed)
@Patient  ID: Ryan Daniel, male    DOB: [birth year], 72 y.o.   MRN: [id number]  Chief Complaint  Patient presents with   Follow-up    Referring provider: [place], Betty G, MD  HPI: 72 yo male seen for sleep consult 08/11/22 for snoring and daytime sleepiness found to have severe OSA  Participates in the lung cancer CT screening program.  TEST/EVENTS :  split-night sleep study this was done on October 16, 2022 that showed severe sleep apnea with a 51.7/hour and SpO2 low at 83%.   02/09/2023 Follow up: OSA  Patient presents for a 45-month follow-up.  Patient was found to have severe sleep apnea Started on CPAP therapy last visit.  Patient says he is doing well on CPAP.  Feels that he is benefiting from CPAP with decreased daytime sleepiness.  CPAP download shows excellent compliance with daily average usage at 9 hours.  Patient is on auto CPAP 5 to 15 cm H2O.  Daily average pressure 12.3 cm H2O.  AHI is 1.8/hour.  We discussed smoking cessation in detail . Participates in lung cancer CT screening program.  Uses a full facemask  Allergies  Allergen Reactions   Oxycodone Nausea And Vomiting   Amlodipine    Prochlorperazine Edisylate     Immunization History  Administered Date(s) Administered   Pneumococcal Conjugate-13 02/23/2017    Past Medical History:  Diagnosis Date   DEPRESSION 02/05/2010   GERD 02/05/2010   HYPERTENSION 02/05/2010   TOBACCO ABUSE 09/15/2010    Tobacco History: Social History   Tobacco Use  Smoking Status Every Day   Packs/day: 1.00   Years: 52.00   Additional pack years: 0.00   Total pack years: 52.00   Types: Cigarettes, Cigars   Start date: 01/17/1969   Last attempt to quit: 11/01/2018   Years since quitting: 4.2  Smokeless Tobacco Never  Tobacco Comments   1 cigar daily for the past year.   Ready to quit: Not Answered Counseling given: Not Answered Tobacco comments: 1 cigar daily for the past year.   Outpatient Medications Prior to Visit   Medication Sig Dispense Refill   aspirin EC (ASPIR-LOW) 81 MG tablet      atenolol (TENORMIN) 50 MG tablet Take 1 tablet (50 mg total) by mouth daily. 90 tablet 1   dapagliflozin propanediol (FARXIGA) 10 MG TABS tablet      famotidine (PEPCID) 20 MG tablet      fenofibrate (TRICOR) 145 MG tablet TAKE 1 TABLET EVERY DAY 90 tablet 3   rosuvastatin (CRESTOR) 20 MG tablet TAKE 1 TABLET EVERY DAY 90 tablet 2   sertraline (ZOLOFT) 50 MG tablet TAKE 1 TABLET EVERY DAY 90 tablet 3   SYMBICORT 160-4.5 MCG/ACT inhaler INHALE 2 PUFFS TWICE DAILY 3 each 3   No facility-administered medications prior to visit.     Review of Systems:   Constitutional:   No  weight loss, night sweats,  Fevers, chills, fatigue, or  lassitude.  HEENT:   No headaches,  Difficulty swallowing,  Tooth/dental problems, or  Sore throat,                No sneezing, itching, ear ache, nasal congestion, post nasal drip,   CV:  No chest pain,  Orthopnea, PND, swelling in lower extremities, anasarca, dizziness, palpitations, syncope.   GI  No heartburn, indigestion, abdominal pain, nausea, vomiting, diarrhea, change in bowel habits, loss of appetite, bloody stools.   Resp: No shortness of breath with exertion or at rest.  No excess mucus, no productive cough,  No non-productive cough,  No coughing up of blood.  No change in color of mucus.  No wheezing.  No chest wall deformity  Skin: no rash or lesions.  GU: no dysuria, change in color of urine, no urgency or frequency.  No flank pain, no hematuria   MS:  No joint pain or swelling.  No decreased range of motion.  No back pain.    Physical Exam  BP (!) 146/80 (BP Location: Left Arm, Patient Position: Sitting, Cuff Size: Normal)   Pulse 63   Temp 97.8 F (36.6 C) (Oral)   Ht 5\' 10"  (1.778 m)   Wt 210 lb (95.3 kg)   SpO2 97%   BMI 30.13 kg/m   GEN: A/Ox3; pleasant , NAD, well nourished    HEENT:  [place]/AT,  NOSE-clear, THROAT-clear, no lesions, no postnasal drip or  exudate noted. Class 3 MP airway   NECK:  Supple w/ fair ROM; no JVD; normal carotid impulses w/o bruits; no thyromegaly or nodules palpated; no lymphadenopathy.    RESP  Clear  P & A; w/o, wheezes/ rales/ or rhonchi. no accessory muscle use, no dullness to percussion  CARD:  RRR, no m/r/g, no peripheral edema, pulses intact, no cyanosis or clubbing.  GI:   Soft & nt; nml bowel sounds; no organomegaly or masses detected.   Musco: Warm bil, no deformities or joint swelling noted.   Neuro: alert, no focal deficits noted.    Skin: Warm, no lesions or rashes    Lab Results:  CBC   BNP No results found for: "BNP"  ProBNP   Imaging: No results found.        No data to display          No results found for: "NITRICOXIDE"      Assessment & Plan:   OSA (obstructive sleep apnea) Severe obstructive sleep apnea with excellent control and compliance on nocturnal CPAP  Plan  Patient Instructions  Continue on CPAP at bedtime goal is to wear all night long for at least 6 or more hours Work on healthy weight. Do not drive if sleepy Work on quitting smoking  1800[employer]uitnow Continue with lung cancer CT screening program.  Follow-up up in 6 months with Dr. Ander Slade or Percy Comp NP and As needed      TOBACCO ABUSE Smoking cessation discussed in detail     Rexene Edison, NP 02/09/2023

## 2023-02-09 NOTE — Patient Instructions (Addendum)
Continue on CPAP at bedtime goal is to wear all night long for at least 6 or more hours Work on healthy weight. Do not drive if sleepy Work on quitting smoking  1800[employer]uitnow Continue with lung cancer CT screening program.  Follow-up up in 6 months with Dr. Ander Slade or Shequilla Goodgame NP and As needed

## 2023-02-09 NOTE — Assessment & Plan Note (Signed)
Smoking cessation discussed in detail 

## 2023-02-09 NOTE — Assessment & Plan Note (Signed)
Severe obstructive sleep apnea with excellent control and compliance on nocturnal CPAP  Plan  Patient Instructions  Continue on CPAP at bedtime goal is to wear all night long for at least 6 or more hours Work on healthy weight. Do not drive if sleepy Work on quitting smoking  1800[employer]uitnow Continue with lung cancer CT screening program.  Follow-up up in 6 months with Dr. Ander Slade or Quincey Nored NP and As needed

## 2023-03-15 ENCOUNTER — Other Ambulatory Visit: Payer: Self-pay | Admitting: Family Medicine

## 2023-03-15 ENCOUNTER — Encounter: Payer: Self-pay | Admitting: Family Medicine

## 2023-03-15 DIAGNOSIS — M25551 Pain in right hip: Secondary | ICD-10-CM

## 2023-03-16 DIAGNOSIS — N1832 Chronic kidney disease, stage 3b: Secondary | ICD-10-CM | POA: Diagnosis not present

## 2023-03-22 NOTE — Progress Notes (Unsigned)
Office Visit Note   Patient: Ryan Daniel           Date of Birth: June 28, 1951           MRN: [id number] Visit Date: 03/23/2023              Requested by: [place], Betty G, MD [address] [place],  [place] [address] PCP: [place], Betty G, MD   Assessment & Plan: Visit Diagnoses: No diagnosis found.  Plan: ***  Follow-Up Instructions: No follow-ups on file.   Orders:  No orders of the defined types were placed in this encounter.  No orders of the defined types were placed in this encounter.     Procedures: No procedures performed   Clinical Data: No additional findings.   Subjective: No chief complaint on file.   HPI  Review of Systems  Constitutional: Negative.   HENT: Negative.    Eyes: Negative.   Respiratory: Negative.    Cardiovascular: Negative.   Gastrointestinal: Negative.   Endocrine: Negative.   Genitourinary: Negative.   Skin: Negative.   Allergic/Immunologic: Negative.   Neurological: Negative.   Hematological: Negative.   Psychiatric/Behavioral: Negative.    All other systems reviewed and are negative.   Objective: Vital Signs: There were no vitals taken for this visit.  Physical Exam Vitals and nursing note reviewed.  Constitutional:      Appearance: He is well-developed.  HENT:     Head: Normocephalic and atraumatic.  Eyes:     Pupils: Pupils are equal, round, and reactive to light.  Pulmonary:     Effort: Pulmonary effort is normal.  Abdominal:     Palpations: Abdomen is soft.  Musculoskeletal:        General: Normal range of motion.     Cervical back: Neck supple.  Skin:    General: Skin is warm.  Neurological:     Mental Status: He is alert and oriented to person, place, and time.  Psychiatric:        Behavior: Behavior normal.        Thought Content: Thought content normal.        Judgment: Judgment normal.   Ortho Exam  Specialty Comments:  No specialty comments available.  Imaging: No results  found.   PMFS History: Patient Active Problem List   Diagnosis Date Noted  . OSA (obstructive sleep apnea) 02/09/2023  . Snoring 08/11/2022  . Morbid obesity (HCC) 08/11/2022  . CKD (chronic kidney disease), stage III (HCC) 07/22/2022  . Erectile dysfunction 08/21/2020  . Hypomagnesemia 11/28/2018  . Hyperlipidemia, mixed 11/28/2018  . B12 deficiency 02/23/2017  . TOBACCO ABUSE 09/15/2010  . 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 Essential hypertension 02/05/2010  . GERD 02/05/2010   Past Medical History:  Diagnosis Date  . DEPRESSION 02/05/2010  . GERD 02/05/2010  . HYPERTENSION 02/05/2010  . TOBACCO ABUSE 09/15/2010    Family History  Problem Relation Age of Onset  . Cancer Maternal Grandmother        colon  . Heart disease Paternal Grandfather     No past surgical history on file. Social History   Occupational History  . Not on file  Tobacco Use  . Smoking status: Every Day    Packs/day: 1.00    Years: 52.00    Additional pack years: 0.00    Total pack years: 52.00    Types: Cigarettes, Cigars    Start date: 01/17/1969    Last attempt to quit:  11/01/2018    Years since quitting: 4.3  . Smokeless tobacco: Never  . Tobacco comments:    1 cigar daily for the past year.  Vaping Use  . Vaping Use: Never used  Substance and Sexual Activity  . Alcohol use: Yes    Alcohol/week: 7.0 standard drinks of alcohol    Types: 7 Standard drinks or equivalent per week  . Drug use: No  . Sexual activity: Yes    Partners: Female

## 2023-03-23 ENCOUNTER — Other Ambulatory Visit (INDEPENDENT_AMBULATORY_CARE_PROVIDER_SITE_OTHER): Payer: Medicare HMO

## 2023-03-23 ENCOUNTER — Ambulatory Visit: Payer: Medicare HMO | Admitting: Orthopaedic Surgery

## 2023-03-23 ENCOUNTER — Encounter: Payer: Self-pay | Admitting: Orthopaedic Surgery

## 2023-03-23 DIAGNOSIS — N1832 Chronic kidney disease, stage 3b: Secondary | ICD-10-CM | POA: Diagnosis not present

## 2023-03-23 DIAGNOSIS — M25552 Pain in left hip: Secondary | ICD-10-CM

## 2023-03-23 DIAGNOSIS — M25551 Pain in right hip: Secondary | ICD-10-CM | POA: Diagnosis not present

## 2023-03-23 DIAGNOSIS — R809 Proteinuria, unspecified: Secondary | ICD-10-CM | POA: Diagnosis not present

## 2023-03-23 DIAGNOSIS — E875 Hyperkalemia: Secondary | ICD-10-CM | POA: Diagnosis not present

## 2023-03-23 DIAGNOSIS — I129 Hypertensive chronic kidney disease with stage 1 through stage 4 chronic kidney disease, or unspecified chronic kidney disease: Secondary | ICD-10-CM | POA: Diagnosis not present

## 2023-03-23 DIAGNOSIS — E669 Obesity, unspecified: Secondary | ICD-10-CM | POA: Diagnosis not present

## 2023-03-23 DIAGNOSIS — E785 Hyperlipidemia, unspecified: Secondary | ICD-10-CM | POA: Diagnosis not present

## 2023-03-23 MED ORDER — CELECOXIB 200 MG PO CAPS: 200.0000 mg | ORAL_CAPSULE | Freq: Two times a day (BID) | ORAL | 3 refills | Status: DC

## 2023-03-24 LAB — LAB REPORT - SCANNED: EGFR: 49

## 2023-04-15 ENCOUNTER — Other Ambulatory Visit: Payer: Self-pay | Admitting: Family Medicine

## 2023-06-12 IMAGING — DX DG CHEST 2V
2 series · 2 of 2 positions shown · non-contrast
Comparison: None.

CLINICAL DATA: Dyspnea on exertion for a few months

EXAM:
CHEST - 2 VIEW

[chest pa]
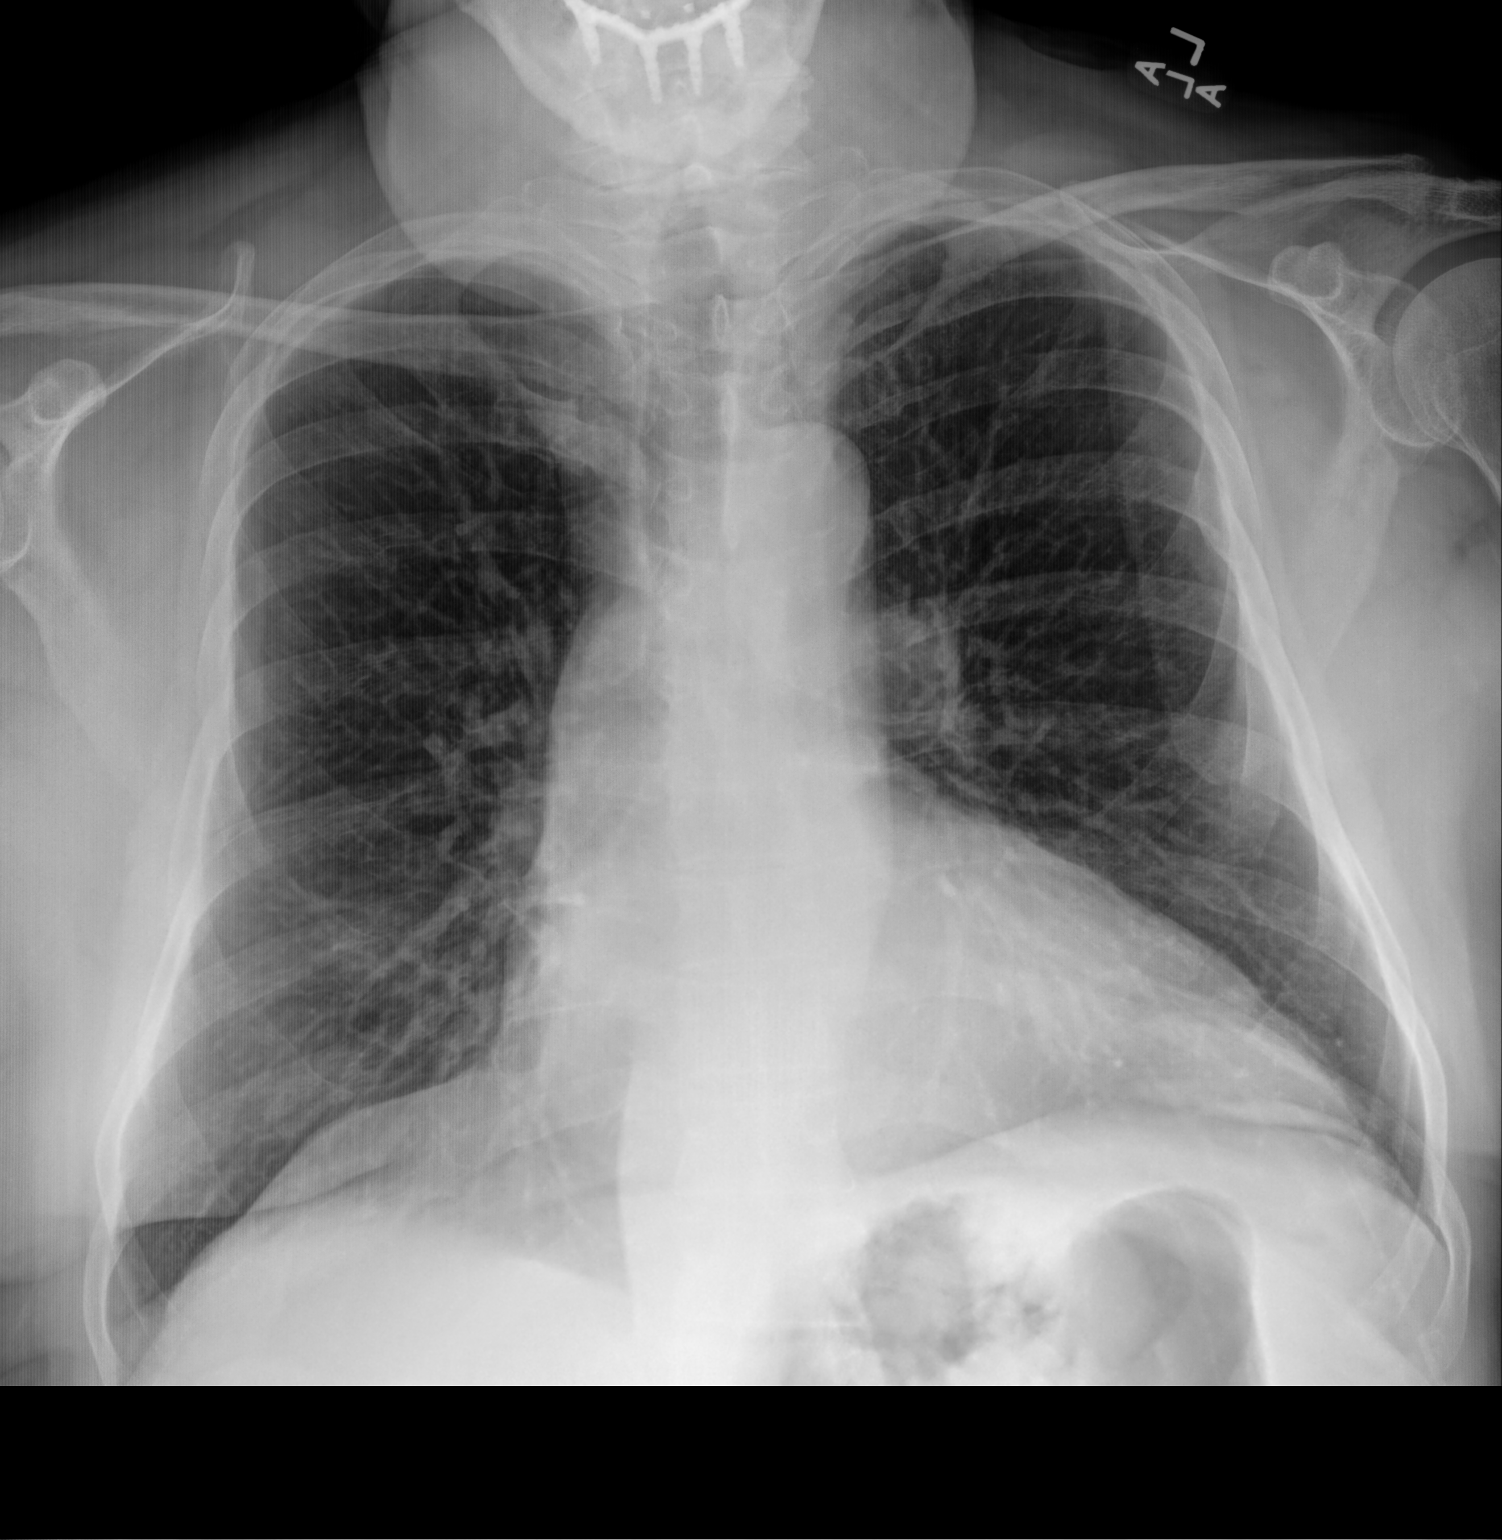

[chest lat]
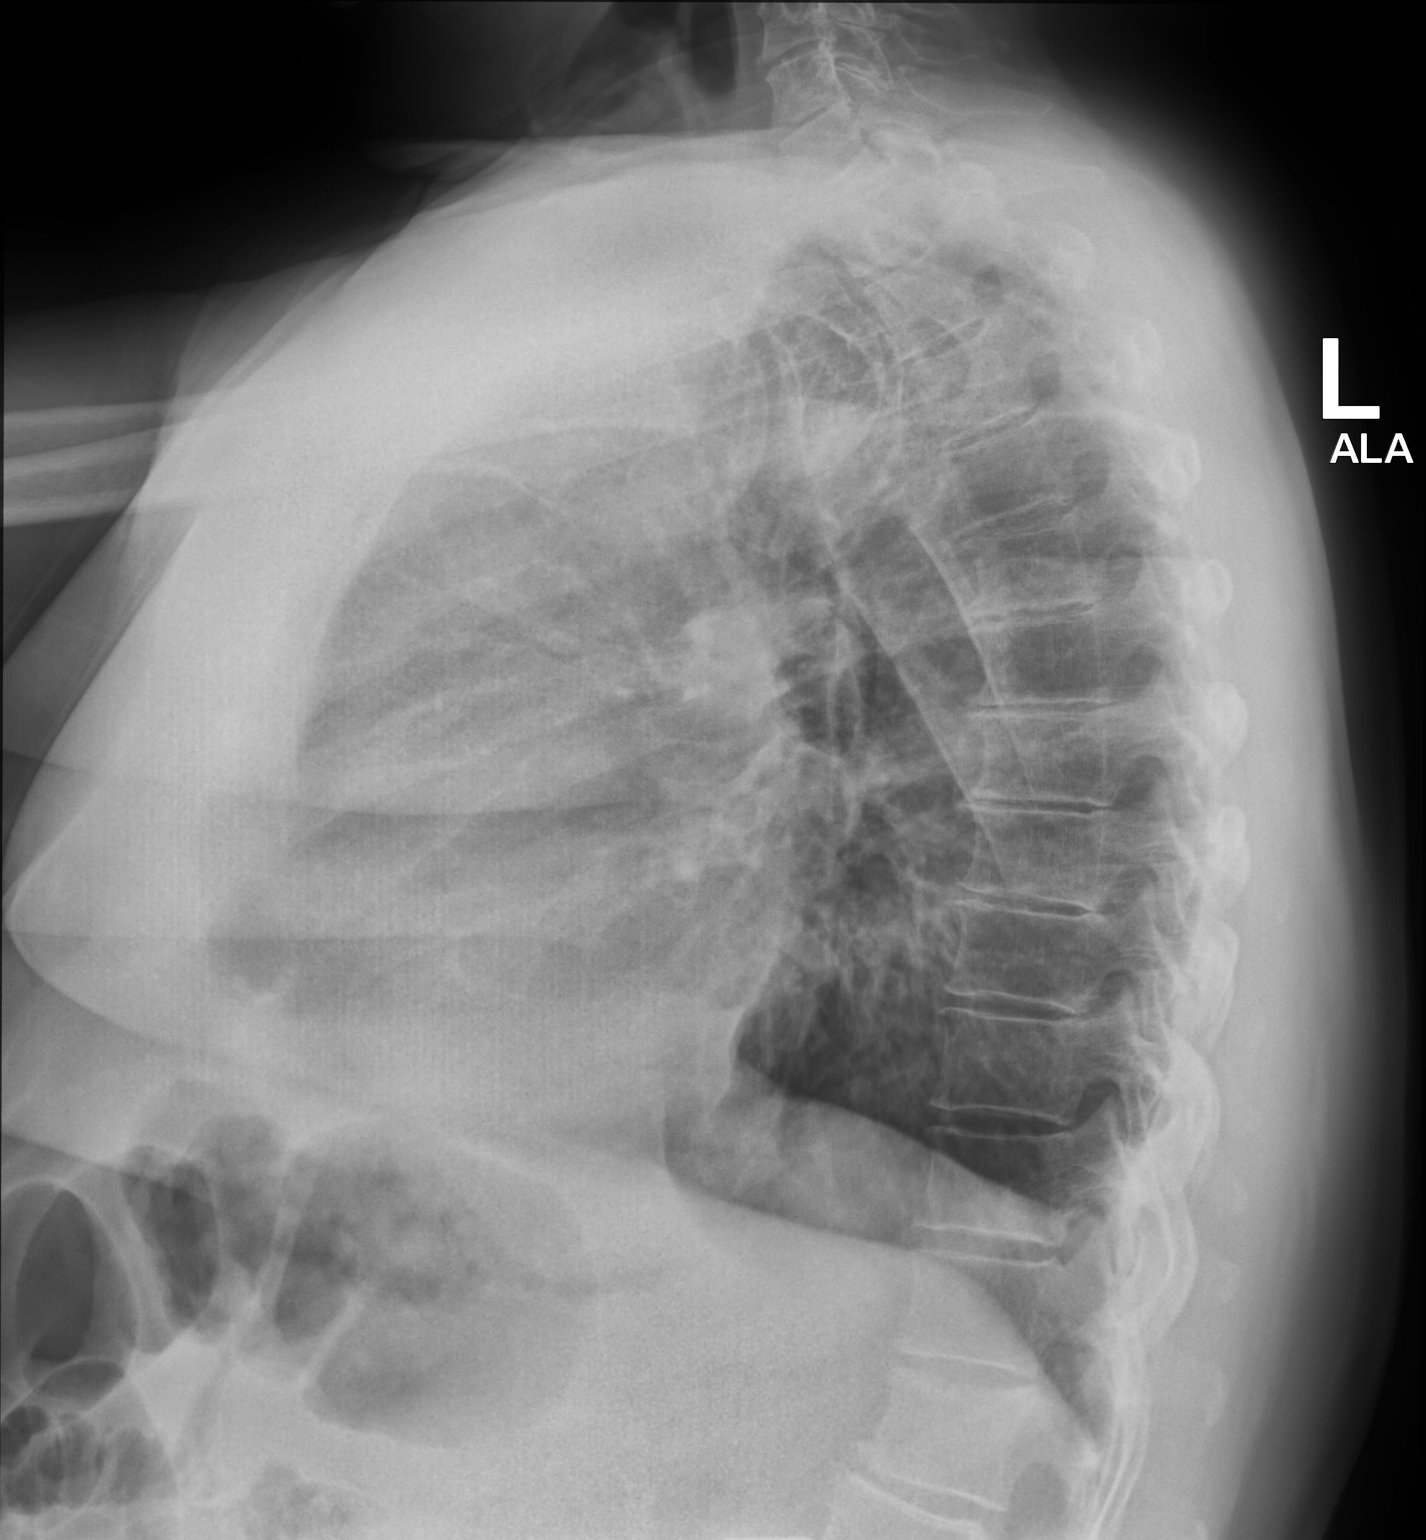

[2 of 2 positions shown; findings below may reference images not displayed]

FINDINGS: Heart is at the upper limits of normal for size. The upper
mediastinal contours are normal.

There is no focal consolidation or pulmonary edema. There is no
pleural effusion or pneumothorax.

There is no acute osseous abnormality.
IMPRESSION: Borderline cardiomegaly. Otherwise, no radiographic evidence of
acute cardiopulmonary process.

## 2023-07-01 ENCOUNTER — Other Ambulatory Visit: Payer: Self-pay | Admitting: Family Medicine

## 2023-07-12 IMAGING — US US RENAL
1 series · 14 of 25 positions shown · non-contrast
Comparison: None.

CLINICAL DATA: Chronic renal disease.

EXAM:
RENAL / URINARY TRACT ULTRASOUND COMPLETE

[Series 1: us renal · 0.24mm/px · 14 of 41 slices shown]
[im 1/41]
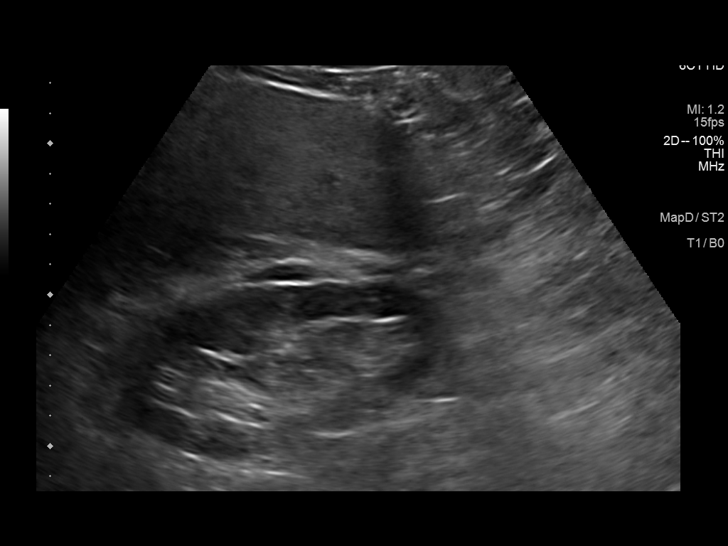
[im 4/41]
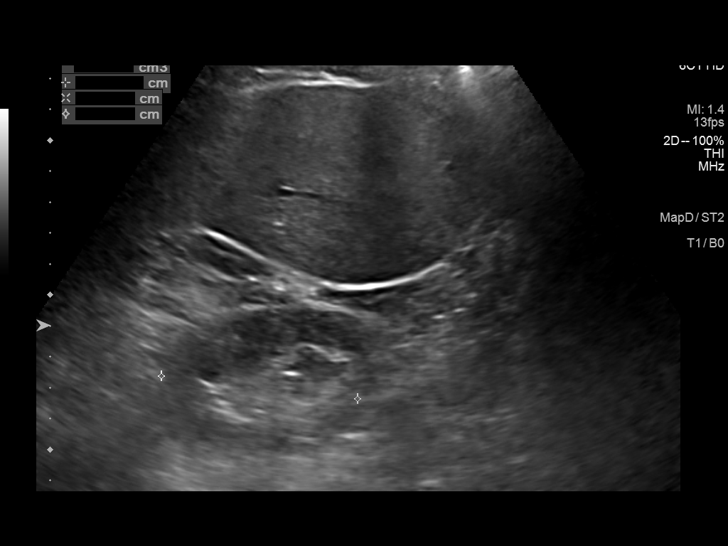
[im 7/41]
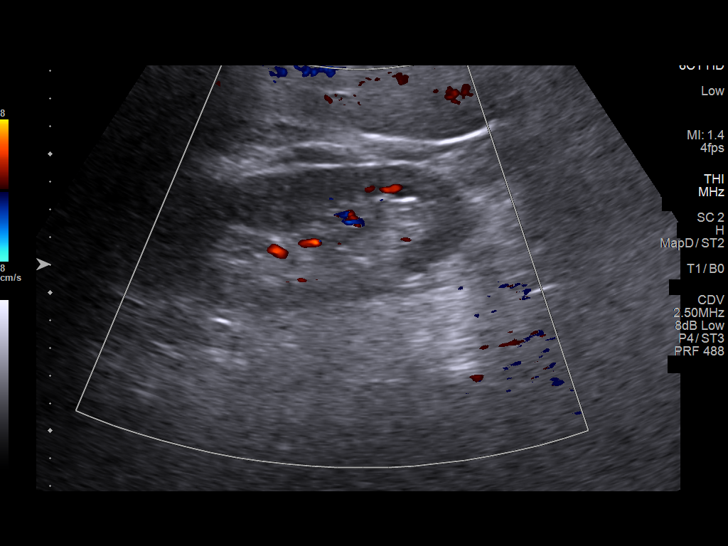
[im 11/41]
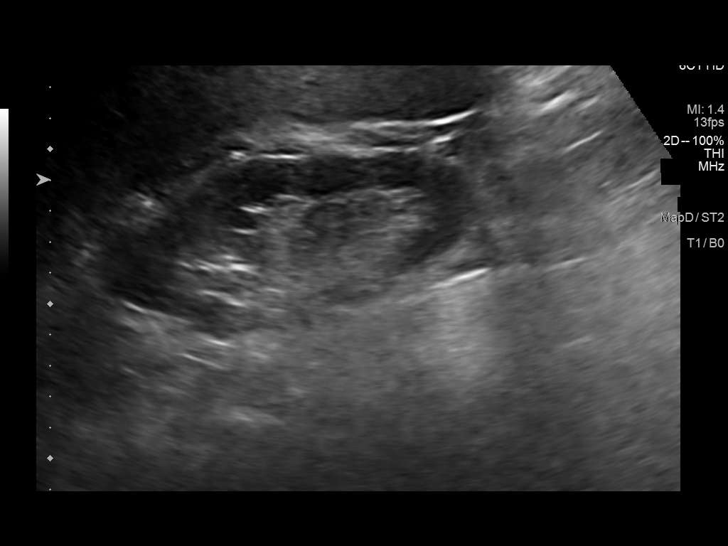
[im 14/41]
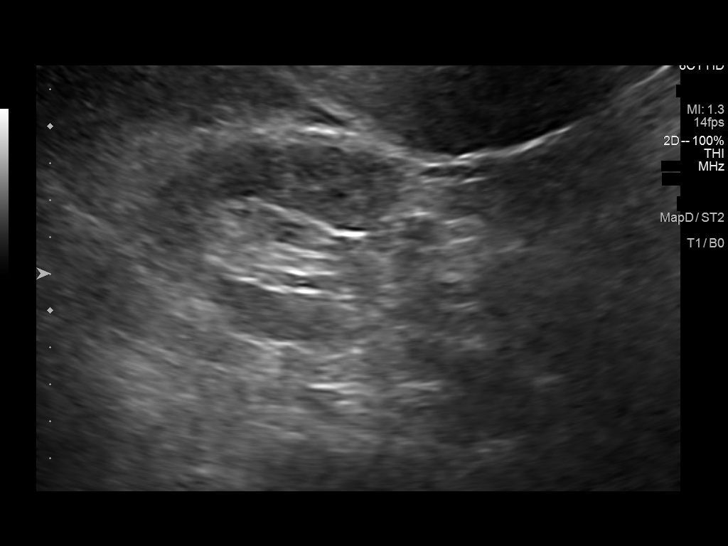
[im 16/41]
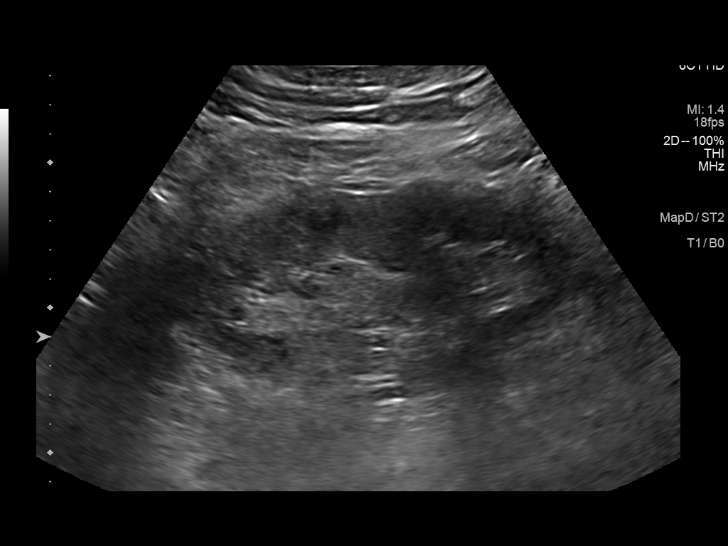
[im 19/41]
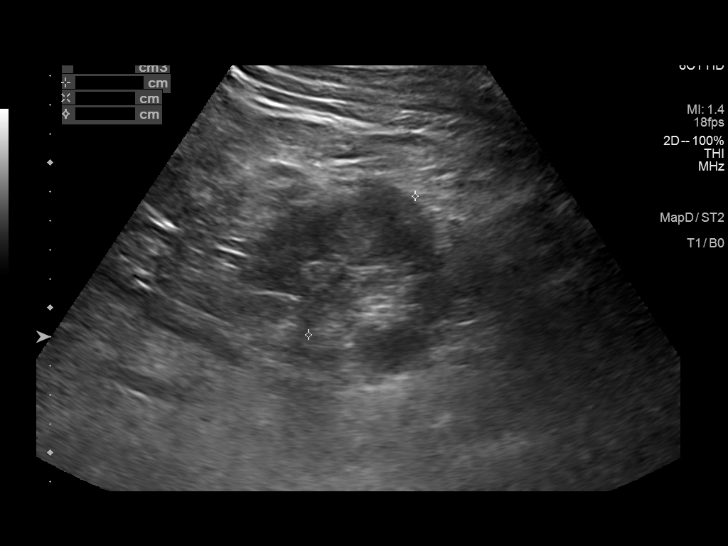
[im 22/41]
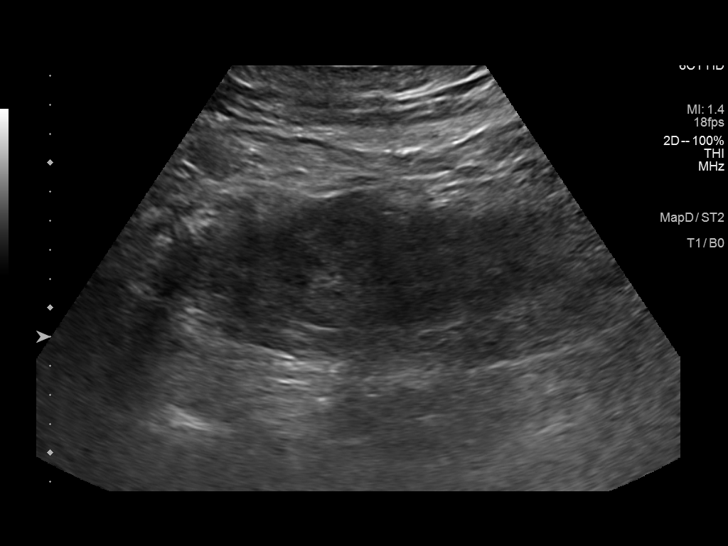
[im 26/41]
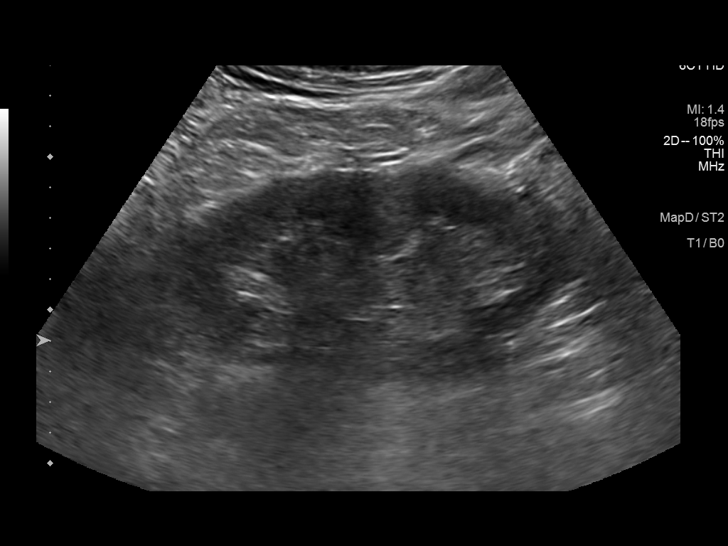
[im 27/41]
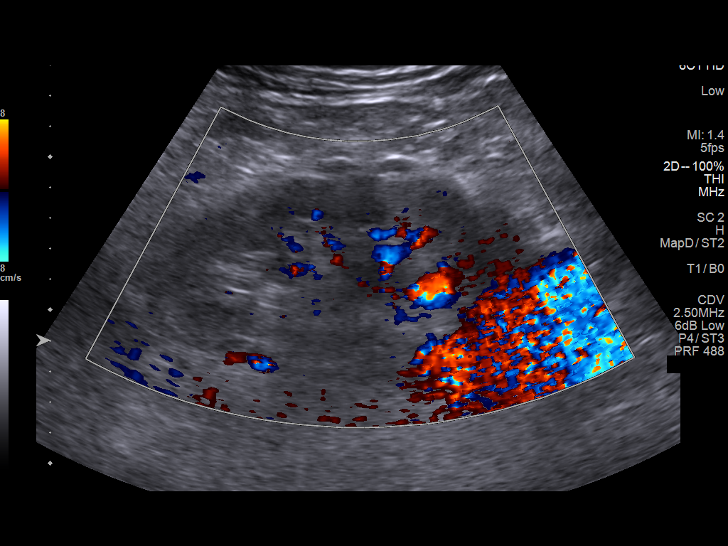
[im 31/41]
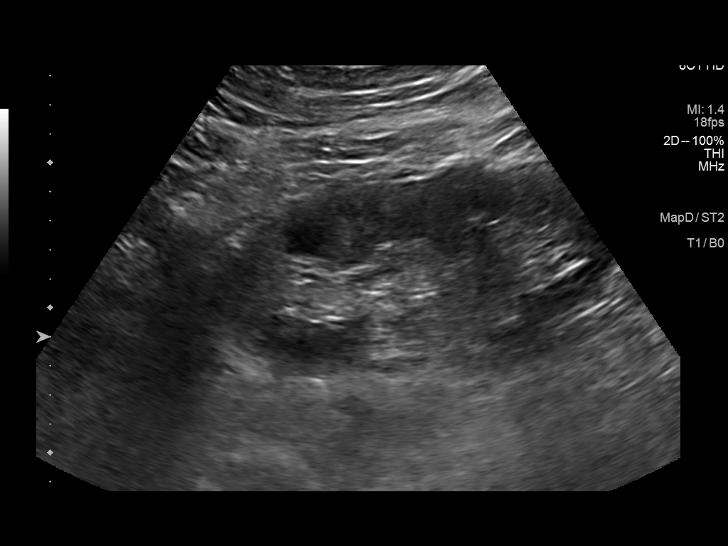
[im 34/41]
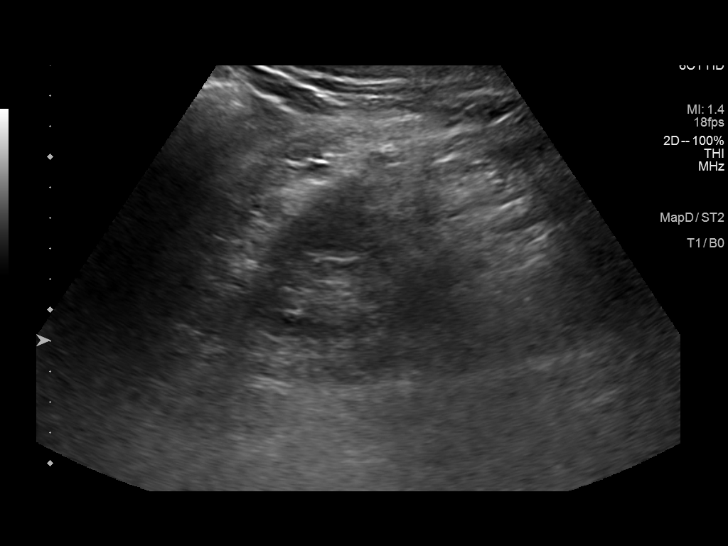
[im 37/41]
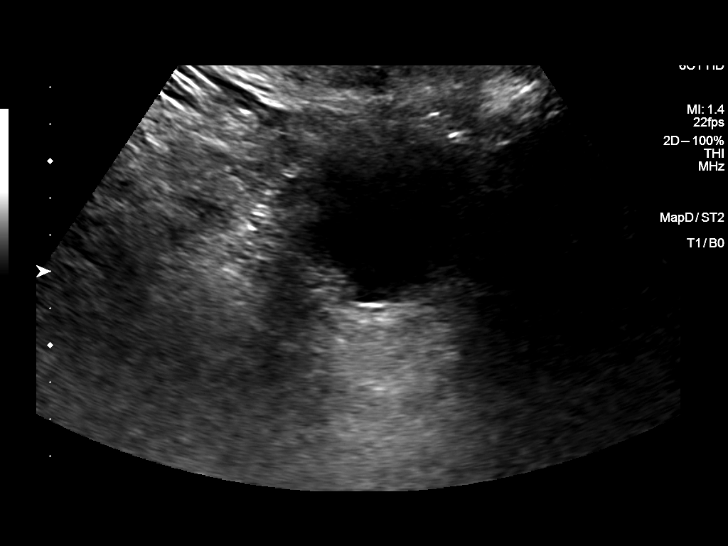
[im 41/41]
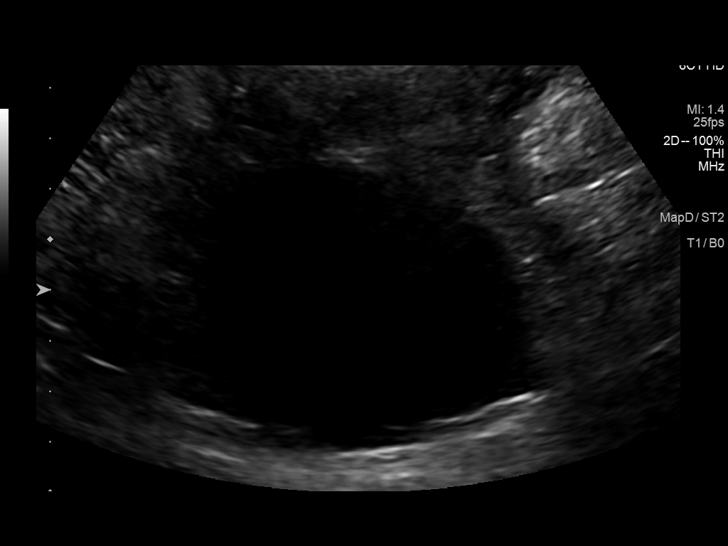

[14 of 25 positions shown; findings below may reference images not displayed]

FINDINGS: Right Kidney:

Renal measurements: 11.3 cm x 5.2 cm x 6.4 cm = volume: 195 mL.
Echogenicity within normal limits. No mass or hydronephrosis
visualized.

Left Kidney:

Renal measurements: 13.1 cm x 5.9 cm x 6.0 cm = volume: 243 mL.
Echogenicity within normal limits. A 1.5 cm x 1.1 cm x 1.2 cm
anechoic structure is seen within the upper pole of the left kidney.
No abnormal flow is noted within this region on color Doppler
evaluation. No hydronephrosis is visualized.

Bladder:

Appears normal for degree of bladder distention.

Other:

None.
IMPRESSION: Simple left renal cyst.

## 2023-08-16 ENCOUNTER — Ambulatory Visit: Payer: Medicare HMO | Admitting: Adult Health

## 2023-08-16 ENCOUNTER — Encounter: Payer: Self-pay | Admitting: Adult Health

## 2023-08-16 VITALS — BP 136/82 | HR 62 | Ht 70.0 in | Wt 207.0 lb

## 2023-08-16 DIAGNOSIS — G4733 Obstructive sleep apnea (adult) (pediatric): Secondary | ICD-10-CM

## 2023-08-16 DIAGNOSIS — F17200 Nicotine dependence, unspecified, uncomplicated: Secondary | ICD-10-CM

## 2023-08-16 NOTE — Assessment & Plan Note (Signed)
Smoking cessation discussed. Continue with  Lung cancer CT screening program

## 2023-08-16 NOTE — Patient Instructions (Addendum)
Continue on CPAP at bedtime goal is to wear all night long for at least 6 or more hours Keep up good work .  Work on healthy weight. Do not drive if sleepy Work on quitting smoking  1800Quitnow Continue with lung cancer CT screening program. -CT chest later this month  Follow-up up in 1year with Dr. Wynona Neat or Yulitza Shorts NP and As needed

## 2023-08-16 NOTE — Assessment & Plan Note (Signed)
Excellent control and compliance on nocturnal CPAP.  Continue on current settings.  Plan  Patient Instructions  Continue on CPAP at bedtime goal is to wear all night long for at least 6 or more hours Keep up good work .  Work on healthy weight. Do not drive if sleepy Work on quitting smoking  1800[employer]uitnow Continue with lung cancer CT screening program. -CT chest later this month  Follow-up up in 1year with Dr. Wynona Neat or Graeden Bitner NP and As needed

## 2023-08-16 NOTE — Progress Notes (Signed)
@Patient  ID: Ryan Daniel, male    DOB: 04/09/1951, 72 y.o.   MRN: [id number]  Chief Complaint  Patient presents with   Follow-up    Referring provider: [place], Betty G, MD  HPI: 72 year old male seen for sleep consult August 11, 2022 for snoring and daytime sleepiness found to have severe obstructive sleep apnea Medical history of CKD, Hyperlipidemia , Hypertension   TEST/EVENTS :  split-night sleep study this was done on October 16, 2022 that showed severe sleep apnea with a 51.7/hour and SpO2 low at 83%.   08/16/2023 Follow up : OSA  Patient presents for 52-month follow-up.  Patient has severe obstructive sleep apnea.  He is on CPAP therapy.  Patient says he is doing well on CPAP.  Feel that he benefits from CPAP with decreased daytime sleepiness.  He is using a fullface mask.  CPAP download shows 100% compliant.  Daily average usage at 8.5 hours.  Patient is on auto CPAP 5 to 15 cm H2O.  Daily average pressure at 14.4 cm H2O.  AHI 2.3/hour.  Patient participated in the lung cancer CT chest screening program.  He is due for an upcoming CT later this month.  Allergies  Allergen Reactions   Oxycodone Nausea And Vomiting   Amlodipine    Prochlorperazine Edisylate     Immunization History  Administered Date(s) Administered   Pneumococcal Conjugate-13 02/23/2017    Past Medical History:  Diagnosis Date   DEPRESSION 02/05/2010   GERD 02/05/2010   HYPERTENSION 02/05/2010   TOBACCO ABUSE 09/15/2010    Tobacco History: Social History   Tobacco Use  Smoking Status Every Day   Current packs/day: 0.00   Average packs/day: 1 pack/day for 52.0 years (52.0 ttl pk-yrs)   Types: Cigarettes, Cigars   Start date: 01/17/1969   Last attempt to quit: 11/01/2018   Years since quitting: 4.7  Smokeless Tobacco Never  Tobacco Comments   1 cigar daily for the past year.   Ready to quit: Not Answered Counseling given: Not Answered Tobacco comments: 1 cigar daily for the past  year.   Outpatient Medications Prior to Visit  Medication Sig Dispense Refill   aspirin EC (ASPIR-LOW) 81 MG tablet      atenolol (TENORMIN) 50 MG tablet TAKE 1 TABLET EVERY DAY 90 tablet 1   dapagliflozin propanediol (FARXIGA) 10 MG TABS tablet      famotidine (PEPCID) 20 MG tablet      fenofibrate (TRICOR) 145 MG tablet TAKE 1 TABLET EVERY DAY 90 tablet 3   rosuvastatin (CRESTOR) 20 MG tablet TAKE 1 TABLET EVERY DAY 90 tablet 1   sertraline (ZOLOFT) 50 MG tablet TAKE 1 TABLET EVERY DAY 90 tablet 3   SYMBICORT 160-4.5 MCG/ACT inhaler INHALE 2 PUFFS TWICE DAILY 3 each 3   celecoxib (CELEBREX) 200 MG capsule Take 1 capsule (200 mg total) by mouth 2 (two) times daily. 30 capsule 3   No facility-administered medications prior to visit.     Review of Systems:   Constitutional:   No  weight loss, night sweats,  Fevers, chills, fatigue, or  lassitude.  HEENT:   No headaches,  Difficulty swallowing,  Tooth/dental problems, or  Sore throat,                No sneezing, itching, ear ache, nasal congestion, post nasal drip,   CV:  No chest pain,  Orthopnea, PND, swelling in lower extremities, anasarca, dizziness, palpitations, syncope.   GI  No heartburn, indigestion, abdominal pain, nausea,  vomiting, diarrhea, change in bowel habits, loss of appetite, bloody stools.   Resp: No shortness of breath with exertion or at rest.  No excess mucus, no productive cough,  No non-productive cough,  No coughing up of blood.  No change in color of mucus.  No wheezing.  No chest wall deformity  Skin: no rash or lesions.  GU: no dysuria, change in color of urine, no urgency or frequency.  No flank pain, no hematuria   MS:  No joint pain or swelling.  No decreased range of motion.  No back pain.    Physical Exam  BP 136/82 (BP Location: Right Arm, Cuff Size: Normal)   Pulse 62   Ht 5\' 10"  (1.778 m)   Wt 207 lb (93.9 kg)   SpO2 97%   BMI 29.70 kg/m   GEN: A/Ox3; pleasant , NAD, well nourished     HEENT:  [place]/AT, NOSE-clear, THROAT-clear, no lesions, no postnasal drip or exudate noted. Class 3 MP airway   NECK:  Supple w/ fair ROM; no JVD; normal carotid impulses w/o bruits; no thyromegaly or nodules palpated; no lymphadenopathy.    RESP  Clear  P & A; w/o, wheezes/ rales/ or rhonchi. no accessory muscle use, no dullness to percussion  CARD:  RRR, no m/r/g, no peripheral edema, pulses intact, no cyanosis or clubbing.  GI:   Soft & nt; nml bowel sounds; no organomegaly or masses detected.   Musco: Warm bil, no deformities or joint swelling noted.   Neuro: alert, no focal deficits noted.    Skin: Warm, no lesions or rashes    Lab Results:    BNP No results found for: "BNP"  ProBNP   Imaging: No results found.  Administration History     None           No data to display          No results found for: "NITRICOXIDE"      Assessment & Plan:   OSA (obstructive sleep apnea) Excellent control and compliance on nocturnal CPAP.  Continue on current settings.  Plan  Patient Instructions  Continue on CPAP at bedtime goal is to wear all night long for at least 6 or more hours Keep up good work .  Work on healthy weight. Do not drive if sleepy Work on quitting smoking  [id number] Continue with lung cancer CT screening program. -CT chest later this month  Follow-up up in 1year with Dr. Wynona Neat or Vraj Denardo NP and As needed      TOBACCO ABUSE Smoking cessation discussed. Continue with  Lung cancer CT screening program     Rubye Oaks, NP 08/16/2023

## 2023-09-12 ENCOUNTER — Other Ambulatory Visit: Payer: Self-pay | Admitting: Family Medicine

## 2023-09-12 DIAGNOSIS — F3341 Major depressive disorder, recurrent, in partial remission: Secondary | ICD-10-CM

## 2023-10-06 DIAGNOSIS — E875 Hyperkalemia: Secondary | ICD-10-CM | POA: Diagnosis not present

## 2023-10-06 DIAGNOSIS — I129 Hypertensive chronic kidney disease with stage 1 through stage 4 chronic kidney disease, or unspecified chronic kidney disease: Secondary | ICD-10-CM | POA: Diagnosis not present

## 2023-10-06 DIAGNOSIS — E669 Obesity, unspecified: Secondary | ICD-10-CM | POA: Diagnosis not present

## 2023-10-06 DIAGNOSIS — N1832 Chronic kidney disease, stage 3b: Secondary | ICD-10-CM | POA: Diagnosis not present

## 2023-10-06 DIAGNOSIS — R809 Proteinuria, unspecified: Secondary | ICD-10-CM | POA: Diagnosis not present

## 2023-10-06 DIAGNOSIS — E785 Hyperlipidemia, unspecified: Secondary | ICD-10-CM | POA: Diagnosis not present

## 2023-10-06 LAB — LAB REPORT - SCANNED: EGFR: 56

## 2023-11-25 ENCOUNTER — Other Ambulatory Visit: Payer: Self-pay | Admitting: Family Medicine

## 2023-11-25 DIAGNOSIS — F3341 Major depressive disorder, recurrent, in partial remission: Secondary | ICD-10-CM

## 2023-12-16 ENCOUNTER — Encounter: Payer: Medicare HMO | Admitting: Family Medicine

## 2023-12-20 ENCOUNTER — Encounter: Payer: Self-pay | Admitting: Family Medicine

## 2023-12-22 ENCOUNTER — Encounter: Payer: Self-pay | Admitting: Family Medicine

## 2023-12-22 ENCOUNTER — Ambulatory Visit (INDEPENDENT_AMBULATORY_CARE_PROVIDER_SITE_OTHER): Payer: Medicare HMO | Admitting: Family Medicine

## 2023-12-22 VITALS — BP 130/80 | HR 64 | Temp 98.5°F | Resp 16 | Ht 70.0 in | Wt 208.1 lb

## 2023-12-22 DIAGNOSIS — E782 Mixed hyperlipidemia: Secondary | ICD-10-CM

## 2023-12-22 DIAGNOSIS — F3341 Major depressive disorder, recurrent, in partial remission: Secondary | ICD-10-CM | POA: Diagnosis not present

## 2023-12-22 DIAGNOSIS — I10 Essential (primary) hypertension: Secondary | ICD-10-CM

## 2023-12-22 DIAGNOSIS — R21 Rash and other nonspecific skin eruption: Secondary | ICD-10-CM

## 2023-12-22 DIAGNOSIS — N1832 Chronic kidney disease, stage 3b: Secondary | ICD-10-CM | POA: Diagnosis not present

## 2023-12-22 MED ORDER — CLOTRIMAZOLE-BETAMETHASONE 1-0.05 % EX CREA
1.0000 | TOPICAL_CREAM | Freq: Every day | CUTANEOUS | 0 refills | Status: AC
Start: 2023-12-22 — End: 2024-01-05

## 2023-12-22 NOTE — Patient Instructions (Addendum)
A few things to remember from today's visit:  Rash/skin eruption  Essential hypertension  No changes in blood pressure meds today. Please be sure your liver and cholesterol have been checked.  Skin lesions could be vascular, do not seem something serious. Monitor for changes, it they grow we need to ask dermatology to evaluate. Try Lotrisone 2 times daily for 2 weeks, small amount. Monitor for new symptoms.  If you need refills for medications you take chronically, please call your pharmacy. Do not use My Chart to request refills or for acute issues that need immediate attention. If you send a my chart message, it may take a few days to be addressed, specially if I am not in the office.  Please be sure medication list is accurate. If a new problem present, please set up appointment sooner than planned today.

## 2023-12-22 NOTE — Progress Notes (Unsigned)
ACUTE VISIT Chief Complaint  Patient presents with   redness   HPI: [name] is a 73 y.o. male with a PMHx significant for hypomagnesemia, HLD, GERD, HTN, B12 deficiency, CKD III and depression, who is here today complaining of redness around surgical wounds.  Last seen on 07/22/22.  He complains of redness around  the right side of his abdominal surgical scares since 2/7.  He is not concerns but his wife is.  He denies pruritus or pain. He says he notices increased redness of affected area after he gets out of the shower.  No fever or chills.  He has not noted changes in size.  Negative for abdominal pain, nausea, vomiting, changes in bowel habits, blood in stool or melena.  Hypertension:  Medications: Currently on atenolol 50 mg daily.  BP readings at home: He has been checking his BP at home and says his readings are good.  Side effects: none Negative for unusual or severe headache, visual changes, exertional chest pain, dyspnea,  focal weakness, or edema.  Lab Results  Component Value Date   CREATININE 1.3 09/29/2022   BUN 24 (A) 09/29/2022   NA 137 09/29/2022   K 4.1 09/29/2022   CL 100 09/29/2022   CO2 29 (A) 09/29/2022   CKD III:  Currently on farxiga 10 mg daily.  He sees his nephrologist every 6 months.   OSA:  He has been sleeping with a CPAP for about a year. He is following with pulmonology.   HLD: currently on rosuvastatin 20 mg daily.   Depression: Currently on sertraline 50 mg daily.  He reports that medication is helping with symptoms.  He smoked cigarettes until 2019. He is still smoking cigars.  Last chest CT on 12/04/22.  Review of Systems  Constitutional:  Negative for activity change, appetite change and fever.  HENT:  Negative for nosebleeds, sore throat and trouble swallowing.   Respiratory:  Negative for cough and wheezing.   Genitourinary:  Negative for decreased urine volume, dysuria and hematuria.  Skin:  Negative for pallor and  wound.  Neurological:  Negative for syncope, facial asymmetry and numbness.  Psychiatric/Behavioral:  Negative for confusion and hallucinations.   See other pertinent positives and negatives in HPI.  Current Outpatient Medications on File Prior to Visit  Medication Sig Dispense Refill   aspirin EC (ASPIR-LOW) 81 MG tablet      atenolol (TENORMIN) 50 MG tablet Take 1 tablet (50 mg total) by mouth daily. Due for follow up 90 tablet 0   budesonide-formoterol (SYMBICORT) 160-4.5 MCG/ACT inhaler INHALE 2 PUFFS TWICE DAILY 3 each 0   dapagliflozin propanediol (FARXIGA) 10 MG TABS tablet      famotidine (PEPCID) 20 MG tablet      fenofibrate (TRICOR) 145 MG tablet TAKE 1 TABLET EVERY DAY 90 tablet 3   rosuvastatin (CRESTOR) 20 MG tablet Take 1 tablet (20 mg total) by mouth daily. Due for follow up 90 tablet 0   sertraline (ZOLOFT) 50 MG tablet Take 1 tablet (50 mg total) by mouth daily. Due for follow up 90 tablet 0   No current facility-administered medications on file prior to visit.   Past Medical History:  Diagnosis Date   DEPRESSION 02/05/2010   GERD 02/05/2010   HYPERTENSION 02/05/2010   TOBACCO ABUSE 09/15/2010   Allergies  Allergen Reactions   Oxycodone Nausea And Vomiting   Amlodipine    Prochlorperazine Edisylate     Social History   Socioeconomic History   Marital  status: Married    Spouse name: Not on file   Number of children: Not on file   Years of education: Not on file   Highest education level: Some college, no degree  Occupational History   Not on file  Tobacco Use   Smoking status: Every Day    Current packs/day: 0.00    Average packs/day: 1 pack/day for 52.0 years (52.0 ttl pk-yrs)    Types: Cigarettes, Cigars    Start date: 01/17/1969    Last attempt to quit: 11/01/2018    Years since quitting: 5.1   Smokeless tobacco: Never   Tobacco comments:    1 cigar daily for the past year.  Vaping Use   Vaping status: Never Used  Substance and Sexual Activity    Alcohol use: Yes    Alcohol/week: 7.0 standard drinks of alcohol    Types: 7 Standard drinks or equivalent per week   Drug use: No   Sexual activity: Yes    Partners: Female  Other Topics Concern   Not on file  Social History Narrative   Not on file   Social Drivers of Health   Financial Resource Strain: Low Risk  (07/21/2022)   Overall Financial Resource Strain (CARDIA)    Difficulty of Paying Living Expenses: Not hard at all  Food Insecurity: No Food Insecurity (07/21/2022)   Hunger Vital Sign    Worried About Running Out of Food in the Last Year: Never true    Ran Out of Food in the Last Year: Never true  Transportation Needs: No Transportation Needs (07/21/2022)   PRAPARE - Administrator, Civil Service (Medical): No    Lack of Transportation (Non-Medical): No  Physical Activity: Insufficiently Active (07/21/2022)   Exercise Vital Sign    Days of Exercise per Week: 1 day    Minutes of Exercise per Session: 20 min  Stress: No Stress Concern Present (07/21/2022)   [employer] of Occupational Health - Occupational Stress Questionnaire    Feeling of Stress : Not at all  Social Connections: Moderately Isolated (07/21/2022)   Social Connection and Isolation Panel [NHANES]    Frequency of Communication with Friends and Family: Once a week    Frequency of Social Gatherings with Friends and Family: Once a week    Attends Religious Services: 1 to 4 times per year    Active Member of [employer] or Organizations: No    Attends Banker Meetings: Not on file    Marital Status: Living with partner    Vitals:   12/22/23 1251  BP: 130/80  Pulse: 64  Resp: 16  Temp: 98.5 F (36.9 C)  SpO2: 96%   Body mass index is 29.86 kg/m.  Physical Exam Vitals and nursing note reviewed.  Constitutional:      General: He is not in acute distress.    Appearance: He is well-developed.  HENT:     Head: Normocephalic and atraumatic.  Eyes:     Conjunctiva/sclera:  Conjunctivae normal.  Cardiovascular:     Rate and Rhythm: Normal rate and regular rhythm.     Pulses:          Dorsalis pedis pulses are 2+ on the right side and 2+ on the left side.     Heart sounds: No murmur heard. Pulmonary:     Effort: Pulmonary effort is normal. No respiratory distress.     Breath sounds: Normal breath sounds.  Abdominal:     Palpations: Abdomen is soft.  There is no hepatomegaly or mass.     Tenderness: There is no abdominal tenderness.  Musculoskeletal:     Right lower leg: 1+ Pitting Edema present.     Left lower leg: 1+ Pitting Edema present.  Skin:    General: Skin is warm.     Findings: Rash present. No erythema. Rash is not papular, pustular, urticarial or vesicular.     Comments: On abdomen, around some of old surgical scars slightly raised pinkish skin lesions, flatten after pressing for a few seconds and raise again when let them go. Not tender, no local heat, no telangiectasis. Slightly scaly. See picture.  Neurological:     Mental Status: He is alert and oriented to person, place, and time.     Cranial Nerves: No cranial nerve deficit.     Gait: Gait normal.  Psychiatric:        Mood and Affect: Mood and affect normal.    ASSESSMENT AND PLAN:  [name] was seen today for redness around surgical wounds.   Rash/skin eruption Unclear etiology. It doe snot seem to be infectious. Hx and examination today do not suggest a serious process. ? Fungal. Recommend trial of Lotrisone bid, small amount for 14 days. Monitor for changes, if growing or spreading, may need Bx, referral to derma will be recommended, he agrees with holding on referral for now.  -     Clotrimazole-Betamethasone; Apply 1 Application topically daily for 14 days.  Dispense: 14 g; Refill: 0  Essential hypertension Assessment & Plan: Otherwise adequate BP, reporting "good" BP's at home. Continue Atenolol 50 mg daily. Continue monitoring BP regularly. Following with  nephrologist every 6 months. I can see him back in a year, before if needed.   Stage 3b chronic kidney disease (HCC) Assessment & Plan: Currently on Farxiga 10 mg daily. Following with nephrologist q 6 months.   Depression, major, recurrent, in partial remission (HCC) Assessment & Plan: Problem seems to be well controlled. Continue Sertraline 50 mg daily. Continue annual follow ups as far as symptoms are stable.   Hyperlipidemia, mixed Assessment & Plan: LDL 164 in 11/2021. He thinks his nephrologist has checked FLP. Continue Rosuvastatin 20 mg daily and low fat diet.   Return in about 1 year (around 12/21/2024) for CPE.  I, Ryan Daniel, acting as a scribe for Ryan Aguilera [place], MD., have documented all relevant documentation on the behalf of Ryan Grawe [place], MD, as directed by  Micheline Markes [place], MD while in the presence of Ryan Sweetin [place], MD.   I, Ryan Cermak [place], MD, have reviewed all documentation for this visit. The documentation on 12/23/23 for the exam, diagnosis, procedures, and orders are all accurate and complete.  Ryan Vasques G. [place], MD  [facility]. [place] office.

## 2023-12-22 NOTE — Assessment & Plan Note (Signed)
Problem seems to be well controlled. Continue Sertraline 50 mg daily. Continue annual follow ups as far as symptoms are stable.

## 2023-12-22 NOTE — Assessment & Plan Note (Signed)
Reporting "good" BP's at home. Following with nephrologist every 6 months.

## 2023-12-22 NOTE — Assessment & Plan Note (Signed)
Following with nephrologist q 6 months.

## 2023-12-23 NOTE — Assessment & Plan Note (Signed)
LDL 164 in 11/2021. He thinks his nephrologist has checked FLP. Continue Rosuvastatin 20 mg daily and low fat diet.

## 2023-12-30 ENCOUNTER — Other Ambulatory Visit: Payer: Self-pay | Admitting: *Deleted

## 2023-12-30 ENCOUNTER — Ambulatory Visit: Payer: Medicare HMO | Admitting: Family Medicine

## 2023-12-30 DIAGNOSIS — Z0000 Encounter for general adult medical examination without abnormal findings: Secondary | ICD-10-CM

## 2023-12-30 DIAGNOSIS — Z122 Encounter for screening for malignant neoplasm of respiratory organs: Secondary | ICD-10-CM

## 2023-12-30 DIAGNOSIS — Z87891 Personal history of nicotine dependence: Secondary | ICD-10-CM

## 2023-12-30 NOTE — Progress Notes (Signed)
 PATIENT CHECK-IN and HEALTH RISK ASSESSMENT QUESTIONNAIRE:  -completed by phone/video for upcoming Medicare Preventive Visit  -Please select "NOT IN PERSON" for method of visit.  Pre-Visit Check-in: 1)Vitals (height, wt, BP, etc) - record in vitals section for visit on day of visit Request home vitals (wt, BP, etc.) and enter into vitals, THEN update Vital Signs SmartPhrase below at the top of the HPI. See below.  2)Review and Update Medications, Allergies PMH, Surgeries, Social history in Epic 3)Hospitalizations in the last year with date/reason?  no  4)Review and Update Care Team (patient's specialists) in Epic 5) Complete PHQ9 in Epic  6) Complete Fall Screening in Epic 7)Review all Health Maintenance Due and order under PCP if not done.  Medicare Wellness Patient Questionnaire:  Answer theses question about your habits: How often do you have a drink containing alcohol? 2 daily  Have you ever smoked?yes quit over 6 years ago, currently 1 cigar  here and there How many packs a day do/did you smoke? 1ppd Do you use smokeless tobacco?n Do you use an illicit drugs?n On average, how many days per week do you engage in moderate to strenuous exercise (like a brisk walk)?was trying to walking until this cold weather Typical breakfast: cereal - raisin bran, Ranker muffin, an egg or two - tries to avoid sugars, tries to limit bacon Typical dinner: baked chicken seafood, avoids fried foods, wife is very careful about the diet Typical snacks: peanuts, pretzels - avoids sweets  Beverages:  water, coffee - he gave up some of the sodas, does drink some ginger  Answer theses question about your everyday activities: Can you perform most household chores?y Are you deaf or have significant trouble hearing?n Do you feel that you have a problem with memory?n Do you feel safe at home?y Last dentist visit?goes on regular basis 8. Do you have any difficulty performing your everyday activities?n Are  you having any difficulty walking, taking medications on your own, and or difficulty managing daily home needs?n Do you have difficulty walking or climbing stairs?n Do you have difficulty dressing or bathing?n Do you have difficulty doing errands alone such as visiting a doctor's office or shopping?n Do you currently have any difficulty preparing food and eating?n Do you currently have any difficulty using the toilet?n Do you have any difficulty managing your finances?n Do you have any difficulties with housekeeping of managing your housekeeping?n   Do you have Advanced Directives in place (Living Will, Healthcare Power or Attorney)? no   Last eye Exam and location? Last eye exam a few years ago   Do you currently use prescribed or non-prescribed narcotic or opioid pain medications?n   ----------------------------------------------------------------------------------------------------------------------------------------------------------------------------------------------------------------------  Because this visit was a virtual/telehealth visit, some criteria may be missing or patient reported. Any vitals not documented were not able to be obtained and vitals that have been documented are patient reported.    MEDICARE ANNUAL PREVENTIVE CARE VISIT WITH PROVIDER (Welcome to Medicare, initial annual wellness or annual wellness exam)  Virtual Visit via Video Note  I connected with Ryan Daniel on 12/30/23  by a video enabled telemedicine application and verified that I am speaking with the correct person using two identifiers.  Location patient: home Location provider:work or home office Persons participating in the virtual visit: patient, provider  Concerns and/or follow up today: Saw PCP recently - using cream and is improving.    See HM section in Epic for other details of completed HM.    ROS: negative for report  of fevers, unintentional weight loss, vision changes, vision  loss, hearing loss or change, chest pain, sob, hemoptysis, melena, hematochezia, hematuria, falls, bleeding or bruising, thoughts of suicide or self harm, memory loss  Patient-completed extensive health risk assessment - reviewed and discussed with the patient: See Health Risk Assessment completed with patient prior to the visit either above or in recent phone note. This was reviewed in detailed with the patient today and appropriate recommendations, orders and referrals were placed as needed per Summary below and patient instructions.   Review of Medical History: -PMH, PSH, Family History and current specialty and care providers reviewed and updated and listed below   Patient Care Team: [place], Betty G, MD as PCP - General (Family Medicine) Tyler Pita, MD as Attending Physician (Nephrology)   Past Medical History:  Diagnosis Date   DEPRESSION 02/05/2010   GERD 02/05/2010   HYPERTENSION 02/05/2010   TOBACCO ABUSE 09/15/2010    No past surgical history on file.  Social History   Socioeconomic History   Marital status: Married    Spouse name: Not on file   Number of children: Not on file   Years of education: Not on file   Highest education level: Some college, no degree  Occupational History   Not on file  Tobacco Use   Smoking status: Every Day    Current packs/day: 0.00    Average packs/day: 1 pack/day for 52.0 years (52.0 ttl pk-yrs)    Types: Cigarettes, Cigars    Start date: 01/17/1969    Last attempt to quit: 11/01/2018    Years since quitting: 5.1   Smokeless tobacco: Never   Tobacco comments:    1 cigar daily for the past year.  Vaping Use   Vaping status: Never Used  Substance and Sexual Activity   Alcohol use: Yes    Alcohol/week: 7.0 standard drinks of alcohol    Types: 7 Standard drinks or equivalent per week   Drug use: No   Sexual activity: Yes    Partners: Female  Other Topics Concern   Not on file  Social History Narrative   Not on file    Social Drivers of Health   Financial Resource Strain: Low Risk  (12/29/2023)   Overall Financial Resource Strain (CARDIA)    Difficulty of Paying Living Expenses: Not hard at all  Food Insecurity: No Food Insecurity (12/29/2023)   Hunger Vital Sign    Worried About Running Out of Food in the Last Year: Never true    Ran Out of Food in the Last Year: Never true  Transportation Needs: No Transportation Needs (12/29/2023)   PRAPARE - Administrator, Civil Service (Medical): No    Lack of Transportation (Non-Medical): No  Physical Activity: Insufficiently Active (12/29/2023)   Exercise Vital Sign    Days of Exercise per Week: 1 day    Minutes of Exercise per Session: 20 min  Stress: No Stress Concern Present (12/29/2023)   [employer] of Occupational Health - Occupational Stress Questionnaire    Feeling of Stress : Only a little  Social Connections: Moderately Isolated (12/29/2023)   Social Connection and Isolation Panel [NHANES]    Frequency of Communication with Friends and Family: Once a week    Frequency of Social Gatherings with Friends and Family: Once a week    Attends Religious Services: 1 to 4 times per year    Active Member of [employer] or Organizations: No    Attends Banker Meetings:  Not on file    Marital Status: Married  Intimate Partner Violence: Unknown (02/11/2022)   [facility]   HITS    Physically Hurt: Not on file    Insult or Talk Down To: Not on file    Threaten Physical Harm: Not on file    Scream or Curse: Not on file    Family History  Problem Relation Age of Onset   Cancer Maternal Grandmother        colon   Heart disease Paternal Grandfather     Current Outpatient Medications on File Prior to Visit  Medication Sig Dispense Refill   aspirin EC (ASPIR-LOW) 81 MG tablet      atenolol (TENORMIN) 50 MG tablet Take 1 tablet (50 mg total) by mouth daily. Due for follow up 90 tablet 0    clotrimazole-betamethasone (LOTRISONE) cream Apply 1 Application topically daily for 14 days. 14 g 0   dapagliflozin propanediol (FARXIGA) 10 MG TABS tablet      famotidine (PEPCID) 20 MG tablet      fenofibrate (TRICOR) 145 MG tablet TAKE 1 TABLET EVERY DAY 90 tablet 3   rosuvastatin (CRESTOR) 20 MG tablet Take 1 tablet (20 mg total) by mouth daily. Due for follow up 90 tablet 0   sertraline (ZOLOFT) 50 MG tablet Take 1 tablet (50 mg total) by mouth daily. Due for follow up 90 tablet 0   No current facility-administered medications on file prior to visit.    Allergies  Allergen Reactions   Oxycodone Nausea And Vomiting   Amlodipine    Prochlorperazine Edisylate        Physical Exam Vitals requested from patient and listed below if patient had equipment and was able to obtain at home for this virtual visit: There were no vitals filed for this visit. Estimated body mass index is 29.86 kg/m as calculated from the following:   Height as of 12/22/23: 5\' 10"  (1.778 m).   Weight as of 12/22/23: 208 lb 2 oz (94.4 kg).  EKG (optional): deferred due to virtual visit  GENERAL: alert, oriented, no acute distress detected; full vision exam deferred due to pandemic and/or virtual encounter  HEENT: atraumatic, conjunttiva clear, no obvious abnormalities on inspection of external nose and ears  NECK: normal movements of the head and neck  LUNGS: on inspection no signs of respiratory distress, breathing rate appears normal, no obvious gross SOB, gasping or wheezing  CV: no obvious cyanosis  MS: moves all visible extremities without noticeable abnormality  PSYCH/NEURO: pleasant and cooperative, no obvious depression or anxiety, speech and thought processing grossly intact, Cognitive function grossly intact  Flowsheet Row Office Visit from 12/22/2023 in [facility]  PHQ-9 Total Score 0           12/30/2023    3:47 PM 12/23/2023    9:23 PM 07/22/2022    10:50 AM 11/18/2021    7:55 AM 11/17/2021    4:13 PM  Depression screen PHQ 2/9  Decreased Interest 0 0 0 1 0  Down, Depressed, Hopeless 0 0 0 1 0  PHQ - 2 Score 0 0 0 2 0  Altered sleeping  0 0 0   Tired, decreased energy  0 0 2   Change in appetite  0 0 0   Feeling bad or failure about yourself   0 0 0   Trouble concentrating  0 0 0   Moving slowly or fidgety/restless  0 0 0  Suicidal thoughts  0 0 0   PHQ-9 Score  0 0 4   Difficult doing work/chores  Not difficult at all Not difficult at all         07/21/2022    9:57 AM 07/22/2022   10:50 AM 11/19/2022   11:57 AM 12/29/2023    6:35 PM 12/30/2023    3:47 PM  Fall Risk  Falls in the past year? 0 0 0 0 0  Was there an injury with Fall?  0 0  0  Fall Risk Category Calculator  0 0  0  Fall Risk Category (Retired)  Low Low    (RETIRED) Patient Fall Risk Level  Low fall risk Low fall risk    Patient at Risk for Falls Due to  Other (Comment) No Fall Risks    Fall risk Follow up  Falls evaluation completed Falls evaluation completed  Falls evaluation completed     SUMMARY AND PLAN:  Encounter for annual wellness exam in Medicare patient   Discussed applicable health maintenance/preventive health measures and advised and referred or ordered per patient preferences: -advised to schedule lung cancer screening and provided number to call - advised to call our office if any difficulty with scheduling -he declines all vaccines Health Maintenance  Topic Date Due   COVID-19 Vaccine (1 - 2024-25 season) 01/15/2024 (Originally 07/11/2023)   INFLUENZA VACCINE  02/07/2024 (Originally 06/10/2023)   Zoster Vaccines- Shingrix (1 of 2) 02/18/2024 (Originally 01/17/2002)   Lung Cancer Screening  12/21/2024 (Originally 12/04/2023)   Pneumonia Vaccine 24+ Years old (2 of 2 - PPSV23 or PCV20) 12/21/2024 (Originally 04/20/2017)   Medicare Annual Wellness (AWV)  12/29/2024   Colonoscopy  11/30/2027   Hepatitis C Screening  Completed   HPV VACCINES  Aged  Out   DTaP/Tdap/Td  Discontinued     Education and counseling on the following was provided based on the above review of health and a plan/checklist for the patient, along with additional information discussed, was provided for the patient in the patient instructions :  -Advised on importance of completing advanced directives, discussed options for completing and provided information in patient instructions as well -Advised and counseled on a healthy lifestyle - including the importance of a healthy diet, regular physical activity, social connections and stress management. -Reviewed patient's current diet. Advised and counseled on a whole foods based healthy diet. A summary of a healthy diet was provided in the Patient Instructions.  -reviewed patient's current physical activity level and discussed exercise guidelines for adults. Discussed community resources and ideas for safe exercise at home to assist in meeting exercise guideline recommendations in a safe and healthy way.  -Advise yearly dental visits at minimum and regular eye exams -Advised and counseled on alcohol safe limits, risks/ tobacco use, lung cancer screening  Follow up: see patient instructions   Patient Instructions  I really enjoyed getting to talk with you today! I am available on Tuesdays and Thursdays for virtual visits if you have any questions or concerns, or if I can be of any further assistance.   CHECKLIST FROM ANNUAL WELLNESS VISIT:  -Follow up (please call to schedule if not scheduled after visit):   -schedule regular appointments for follow up with Dr. [place]   -yearly for annual wellness visit with primary care office  Here is a list of your preventive care/health maintenance measures and the plan for each if any are due:  PLAN For any measures below that may be due:  -Please call  [facility] office to schedule your lung cancer screening: [phone]  Health Maintenance  Topic Date Due   COVID-19  Vaccine (1 - 2024-25 season) 01/15/2024 (Originally 07/11/2023)   INFLUENZA VACCINE  02/07/2024 (Originally 06/10/2023)   Zoster Vaccines- Shingrix (1 of 2) 02/18/2024 (Originally 01/17/2002)   Lung Cancer Screening  12/21/2024 (Originally 12/04/2023)   Pneumonia Vaccine 65+ Years old (2 of 2 - PPSV23 or PCV20) 12/21/2024 (Originally 04/20/2017)   Medicare Annual Wellness (AWV)  12/29/2024   Colonoscopy  11/30/2027   Hepatitis C Screening  Completed   HPV VACCINES  Aged Out   DTaP/Tdap/Td  Discontinued    -See a dentist at least yearly  -Get your eyes checked and then per your eye specialist's recommendations  -Other issues addressed today:   -I have included below further information regarding a healthy whole foods based diet, physical activity guidelines for adults, stress management and opportunities for social connections. I hope you find this information useful.   -----------------------------------------------------------------------------------------------------------------------------------------------------------------------------------------------------------------------------------------------------------    NUTRITION: -eat real food: lots of colorful vegetables (half the plate) and fruits -5-7 servings of vegetables and fruits per day (fresh or steamed is best), exp. 2 servings of vegetables with lunch and dinner and 2 servings of fruit per day. Berries and greens such as kale and collards are great choices.  -consume on a regular basis:  fresh fruits, fresh veggies, fish, nuts, seeds, healthy oils (such as olive oil, avocado oil), whole grains (make sure for bread/pasta/crackers/etc., that the first ingredient on label contains the word "whole"), legumes. -can eat small amounts of dairy and lean meat (no larger than the palm of your hand), but avoid processed meats such as ham, bacon, lunch meat, etc. -drink water -try to avoid fast food and pre-packaged foods, processed meat,  ultra processed foods/beverages (donuts, candy, etc.) -most experts advise limiting sodium to < 2300mg  per day, should limit further is any chronic conditions such as high blood pressure, heart disease, diabetes, etc. The American Heart Association advised that < 1500mg  is is ideal -try to avoid foods/beverages that contain any ingredients with names you do not recognize  -try to avoid foods/beverages  with added sugar or sweeteners/sweets  -try to avoid sweet drinks (including diet drinks): soda, juice, Gatorade, sweet tea, power drinks, diet drinks -try to avoid white rice, white bread, pasta (unless whole grain)  EXERCISE GUIDELINES FOR ADULTS: -if you wish to increase your physical activity, do so gradually and with the approval of your doctor -STOP and seek medical care immediately if you have any chest pain, chest discomfort or trouble breathing when starting or increasing exercise  -move and stretch your body, legs, feet and arms when sitting for long periods -Physical activity guidelines for optimal health in adults: -get at least 150 minutes per week of moderate exercise (can talk, but not sing); this is about 20-30 minutes of sustained activity 5-7 days per week or two 10-15 minute episodes of sustained activity 5-7 days per week -do some muscle building/resistance training/strength training at least 2 days per week  -balance exercises 3+ days per week:   Stand somewhere where you have something sturdy to hold onto if you lose balance    1) lift up on toes, then back down, start with 5x per day and work up to 20x   2) stand and lift one leg straight out to the side so that foot is a few inches of the floor, start with 5x each side and work up to 20x each side  3) stand on one foot, start with 5 seconds each side and work up to 20 seconds on each side  If you need ideas or help with getting more active:  -Silver sneakers https://tools.silversneakers.com  -Walk with a  Doc: http://www.duncan-williams.com/  -try to include resistance (weight lifting/strength building) and balance exercises twice per week: or the following link for ideas: http://castillo-powell.com/  BuyDucts.dk  STRESS MANAGEMENT: -can try meditating, or just sitting quietly with deep breathing while intentionally relaxing all parts of your body for 5 minutes daily -if you need further help with stress, anxiety or depression please follow up with your primary doctor or contact the wonderful folks at [facility]: [phone]  SOCIAL CONNECTIONS: -options in [place] if you wish to engage in more social and exercise related activities:  -Silver sneakers https://tools.silversneakers.com  -Walk with a Doc: http://www.duncan-williams.com/  -Check out the [facility] Active Adults 50+ section on the [place] of [employer] (hiking clubs, book clubs, cards and games, chess, exercise classes, aquatic classes and much more) - see the website for details: https://www.[place].gov/departments/parks-recreation/active-adults50  -YouTube has lots of exercise videos for different ages and abilities as well  -[facility] (a variety of indoor and outdoor inperson activities for adults). [phone]. [address]  -Virtual Online Classes (a variety of topics): see seniorplanet.org or call [phone]  -consider volunteering at a school, hospice center, church, senior center or elsewhere          ADVANCED HEALTHCARE DIRECTIVES:  [place] Advanced Directives assistance:   ExpressWeek.com.cy  Everyone should have advanced health care directives in place. This is so that you get the care you want, should you ever be in a situation where you are unable to make your own medical decisions.   From the [place] Advanced  Directive Website: "Advance Health Care Directives are legal documents in which you give written instructions about your health care if, in the future, you cannot speak for yourself.   A health care power of attorney allows you to name a person you trust to make your health care decisions if you cannot make them yourself. A declaration of a desire for a natural death (or living will) is document, which states that you desire not to have your life prolonged by extraordinary measures if you have a terminal or incurable illness or if you are in a vegetative state. An advance instruction for mental health treatment makes a declaration of instructions, information and preferences regarding your mental health treatment. It also states that you are aware that the advance instruction authorizes a mental health treatment provider to act according to your wishes. It may also outline your consent or refusal of mental health treatment. A declaration of an anatomical gift allows anyone over the age of 12 to make a gift by will, organ donor card or other document."   Please see the following website or an elder law attorney for forms, FAQs and for completion of advanced directives: [place] TEFL teacher Health Care Directives Advance Health Care Directives (http://guzman.com/)  Or copy and paste the following to your web browser: PoshChat.fi    Terressa Koyanagi, DO

## 2023-12-30 NOTE — Patient Instructions (Addendum)
 I really enjoyed getting to talk with you today! I am available on Tuesdays and Thursdays for virtual visits if you have any questions or concerns, or if I can be of any further assistance.   CHECKLIST FROM ANNUAL WELLNESS VISIT:  -Follow up (please call to schedule if not scheduled after visit):   -schedule regular appointments for follow up with Dr. Swaziland   -yearly for annual wellness visit with primary care office  Here is a list of your preventive care/health maintenance measures and the plan for each if any are due:  PLAN For any measures below that may be due:  -Please call [facility] office to schedule your lung cancer screening: [phone]  Health Maintenance  Topic Date Due   COVID-19 Vaccine (1 - 2024-25 season) 01/15/2024 (Originally 07/11/2023)   INFLUENZA VACCINE  02/07/2024 (Originally 06/10/2023)   Zoster Vaccines- Shingrix (1 of 2) 02/18/2024 (Originally 01/17/2002)   Lung Cancer Screening  12/21/2024 (Originally 12/04/2023)   Pneumonia Vaccine 90+ Years old (2 of 2 - PPSV23 or PCV20) 12/21/2024 (Originally 04/20/2017)   Medicare Annual Wellness (AWV)  12/29/2024   Colonoscopy  11/30/2027   Hepatitis C Screening  Completed   HPV VACCINES  Aged Out   DTaP/Tdap/Td  Discontinued    -See a dentist at least yearly  -Get your eyes checked and then per your eye specialist's recommendations  -Other issues addressed today:   -I have included below further information regarding a healthy whole foods based diet, physical activity guidelines for adults, stress management and opportunities for social connections. I hope you find this information useful.   -----------------------------------------------------------------------------------------------------------------------------------------------------------------------------------------------------------------------------------------------------------    NUTRITION: -eat real food: lots of colorful vegetables (half the  plate) and fruits -5-7 servings of vegetables and fruits per day (fresh or steamed is best), exp. 2 servings of vegetables with lunch and dinner and 2 servings of fruit per day. Berries and greens such as kale and collards are great choices.  -consume on a regular basis:  fresh fruits, fresh veggies, fish, nuts, seeds, healthy oils (such as olive oil, avocado oil), whole grains (make sure for bread/pasta/crackers/etc., that the first ingredient on label contains the word "whole"), legumes. -can eat small amounts of dairy and lean meat (no larger than the palm of your hand), but avoid processed meats such as ham, bacon, lunch meat, etc. -drink water -try to avoid fast food and pre-packaged foods, processed meat, ultra processed foods/beverages (donuts, candy, etc.) -most experts advise limiting sodium to < 2300mg  per day, should limit further is any chronic conditions such as high blood pressure, heart disease, diabetes, etc. The American Heart Association advised that < 1500mg  is is ideal -try to avoid foods/beverages that contain any ingredients with names you do not recognize  -try to avoid foods/beverages  with added sugar or sweeteners/sweets  -try to avoid sweet drinks (including diet drinks): soda, juice, Gatorade, sweet tea, power drinks, diet drinks -try to avoid white rice, white bread, pasta (unless whole grain)  EXERCISE GUIDELINES FOR ADULTS: -if you wish to increase your physical activity, do so gradually and with the approval of your doctor -STOP and seek medical care immediately if you have any chest pain, chest discomfort or trouble breathing when starting or increasing exercise  -move and stretch your body, legs, feet and arms when sitting for long periods -Physical activity guidelines for optimal health in adults: -get at least 150 minutes per week of moderate exercise (can talk, but not sing); this is about 20-30 minutes of sustained  activity 5-7 days per week or two 10-15 minute  episodes of sustained activity 5-7 days per week -do some muscle building/resistance training/strength training at least 2 days per week  -balance exercises 3+ days per week:   Stand somewhere where you have something sturdy to hold onto if you lose balance    1) lift up on toes, then back down, start with 5x per day and work up to 20x   2) stand and lift one leg straight out to the side so that foot is a few inches of the floor, start with 5x each side and work up to 20x each side   3) stand on one foot, start with 5 seconds each side and work up to 20 seconds on each side  If you need ideas or help with getting more active:  -Silver sneakers https://tools.silversneakers.com  -Walk with a Doc: http://www.duncan-williams.com/  -try to include resistance (weight lifting/strength building) and balance exercises twice per week: or the following link for ideas: http://castillo-powell.com/  BuyDucts.dk  STRESS MANAGEMENT: -can try meditating, or just sitting quietly with deep breathing while intentionally relaxing all parts of your body for 5 minutes daily -if you need further help with stress, anxiety or depression please follow up with your primary doctor or contact the wonderful folks at [facility]: [phone]  SOCIAL CONNECTIONS: -options in [place] if you wish to engage in more social and exercise related activities:  -Silver sneakers https://tools.silversneakers.com  -Walk with a Doc: http://www.duncan-williams.com/  -Check out the [facility] Active Adults 50+ section on the [employer] of [employer] (hiking clubs, book clubs, cards and games, chess, exercise classes, aquatic classes and much more) - see the website for details: https://www.[place].gov/departments/parks-recreation/active-adults50  -YouTube has lots of exercise videos for different ages and abilities as  well  -[facility] (a variety of indoor and outdoor inperson activities for adults). [phone]. [address]  -Virtual Online Classes (a variety of topics): see seniorplanet.org or call [phone]  -consider volunteering at a school, hospice center, church, senior center or elsewhere          ADVANCED HEALTHCARE DIRECTIVES:  [place] Advanced Directives assistance:   ExpressWeek.com.cy  Everyone should have advanced health care directives in place. This is so that you get the care you want, should you ever be in a situation where you are unable to make your own medical decisions.   From the [place] Advanced Directive Website: "Advance Health Care Directives are legal documents in which you give written instructions about your health care if, in the future, you cannot speak for yourself.   A health care power of attorney allows you to name a person you trust to make your health care decisions if you cannot make them yourself. A declaration of a desire for a natural death (or living will) is document, which states that you desire not to have your life prolonged by extraordinary measures if you have a terminal or incurable illness or if you are in a vegetative state. An advance instruction for mental health treatment makes a declaration of instructions, information and preferences regarding your mental health treatment. It also states that you are aware that the advance instruction authorizes a mental health treatment provider to act according to your wishes. It may also outline your consent or refusal of mental health treatment. A declaration of an anatomical gift allows anyone over the age of 45 to make a gift by will, organ donor card or other document."   Please see the  following website or an elder law attorney for forms, FAQs and for completion of advanced directives: Customer service manager Health Care Directives Advance Health Care Directives (http://guzman.com/)  Or copy and paste the following to your web browser: PoshChat.fi

## 2024-01-05 ENCOUNTER — Ambulatory Visit (HOSPITAL_BASED_OUTPATIENT_CLINIC_OR_DEPARTMENT_OTHER)
Admission: RE | Admit: 2024-01-05 | Discharge: 2024-01-05 | Disposition: A | Discharge status: Home / Self Care | Payer: Medicare HMO | Source: Ambulatory Visit | Attending: Family Medicine | Admitting: Family Medicine

## 2024-01-05 DIAGNOSIS — Z87891 Personal history of nicotine dependence: Secondary | ICD-10-CM | POA: Insufficient documentation

## 2024-01-05 DIAGNOSIS — Z122 Encounter for screening for malignant neoplasm of respiratory organs: Secondary | ICD-10-CM | POA: Diagnosis not present

## 2024-01-05 DIAGNOSIS — F17210 Nicotine dependence, cigarettes, uncomplicated: Secondary | ICD-10-CM | POA: Diagnosis not present

## 2024-01-21 ENCOUNTER — Encounter: Payer: Self-pay | Admitting: Family Medicine

## 2024-01-21 NOTE — Telephone Encounter (Signed)
 I have called over to the reading room for the results

## 2024-02-11 ENCOUNTER — Other Ambulatory Visit: Payer: Self-pay | Admitting: Family Medicine

## 2024-02-11 DIAGNOSIS — F3341 Major depressive disorder, recurrent, in partial remission: Secondary | ICD-10-CM

## 2024-03-29 ENCOUNTER — Encounter: Payer: Self-pay | Admitting: Family Medicine

## 2024-03-29 DIAGNOSIS — I7121 Aneurysm of the ascending aorta, without rupture: Secondary | ICD-10-CM | POA: Insufficient documentation

## 2024-05-02 ENCOUNTER — Other Ambulatory Visit: Payer: Self-pay | Admitting: Acute Care

## 2024-05-02 DIAGNOSIS — Z122 Encounter for screening for malignant neoplasm of respiratory organs: Secondary | ICD-10-CM

## 2024-05-02 DIAGNOSIS — Z87891 Personal history of nicotine dependence: Secondary | ICD-10-CM

## 2024-05-02 DIAGNOSIS — F17210 Nicotine dependence, cigarettes, uncomplicated: Secondary | ICD-10-CM

## 2024-06-14 ENCOUNTER — Encounter: Payer: Self-pay | Admitting: Family Medicine

## 2024-06-16 ENCOUNTER — Encounter: Payer: Self-pay | Admitting: Family Medicine

## 2024-06-16 ENCOUNTER — Ambulatory Visit (INDEPENDENT_AMBULATORY_CARE_PROVIDER_SITE_OTHER): Admitting: Family Medicine

## 2024-06-16 ENCOUNTER — Ambulatory Visit: Payer: Self-pay | Admitting: Family Medicine

## 2024-06-16 VITALS — BP 110/70 | HR 64 | Resp 16 | Ht 70.0 in | Wt 188.5 lb

## 2024-06-16 DIAGNOSIS — R7989 Other specified abnormal findings of blood chemistry: Secondary | ICD-10-CM | POA: Diagnosis not present

## 2024-06-16 DIAGNOSIS — E538 Deficiency of other specified B group vitamins: Secondary | ICD-10-CM | POA: Diagnosis not present

## 2024-06-16 DIAGNOSIS — Z0189 Encounter for other specified special examinations: Secondary | ICD-10-CM

## 2024-06-16 DIAGNOSIS — R5383 Other fatigue: Secondary | ICD-10-CM

## 2024-06-16 DIAGNOSIS — F3341 Major depressive disorder, recurrent, in partial remission: Secondary | ICD-10-CM

## 2024-06-16 DIAGNOSIS — R0989 Other specified symptoms and signs involving the circulatory and respiratory systems: Secondary | ICD-10-CM

## 2024-06-16 LAB — CBC
HCT: 46 % (ref 39.0–52.0)
Hemoglobin: 15.3 g/dL (ref 13.0–17.0)
MCHC: 33.3 g/dL (ref 30.0–36.0)
MCV: 91 fl (ref 78.0–100.0)
Platelets: 220 K/uL (ref 150.0–400.0)
RBC: 5.06 Mil/uL (ref 4.22–5.81)
RDW: 13.3 % (ref 11.5–15.5)
WBC: 6.3 K/uL (ref 4.0–10.5)

## 2024-06-16 LAB — COMPREHENSIVE METABOLIC PANEL WITH GFR
ALT: 18 U/L (ref 0–53)
AST: 18 U/L (ref 0–37)
Albumin: 3.9 g/dL (ref 3.5–5.2)
Alkaline Phosphatase: 69 U/L (ref 39–117)
BUN: 21 mg/dL (ref 6–23)
CO2: 31 meq/L (ref 19–32)
Calcium: 9.2 mg/dL (ref 8.4–10.5)
Chloride: 100 meq/L (ref 96–112)
Creatinine, Ser: 1.33 mg/dL (ref 0.40–1.50)
GFR: 53.44 mL/min — ABNORMAL LOW (ref >60.00)
Glucose, Bld: 74 mg/dL (ref 70–99)
Potassium: 4.6 meq/L (ref 3.5–5.1)
Sodium: 140 meq/L (ref 135–145)
Total Bilirubin: 0.4 mg/dL (ref 0.2–1.2)
Total Protein: 7.2 g/dL (ref 6.0–8.3)

## 2024-06-16 LAB — MAGNESIUM: Magnesium: 2 mg/dL (ref 1.5–2.5)

## 2024-06-16 LAB — TESTOSTERONE: Testosterone: 294.51 ng/dL — ABNORMAL LOW (ref 300.00–890.00)

## 2024-06-16 LAB — TSH: TSH: 0.86 u[IU]/mL (ref 0.35–5.50)

## 2024-06-16 LAB — VITAMIN B12: Vitamin B-12: 233 pg/mL (ref 211–911)

## 2024-06-16 NOTE — Assessment & Plan Note (Addendum)
 He reports problem as stable. Symptoms he is reporting today can be related to this problem. Continue sertraline  50 mg daily for now, if labs are otherwise normal, we can consider adjusting dose or adding a new medication.

## 2024-06-16 NOTE — Progress Notes (Signed)
 ACUTE VISIT Chief Complaint  Patient presents with   lack of energy   HPI: [name] is a 73 y.o. male with past medical history significant for hypomagnesemia, hyperlipidemia, GERD, hypertension, B12 deficiency, CKD 3, OSA, and depression ;who is here today complaining of recent decreased energy, ongoing several months.  He also endorses feeling a lack of motivation and depressed.  Depression on Sertraline  50 mg once daily.  His fatigue typically begins in the middle of the day and increases as the day progresses. Sleeps about 8 hours/night with generally restful sleep.  No improvements after taking a nap during the day.  OSA on daily CPAP. He feels rested most of the time when he gets up.  He follows up with pulmonology for sleep apnea once annually.   Earlier in the year, he started working on losing weight and made healthier dietary changes.  He also significantly cut back on drinking beer in 01/2024, only drinking 1 beer occasionally.  Despite these changes, he continues to experience fatigue.  He is not falling asleep while driving.  No recent cold-like symptoms.  Would like testosterone  check. Denies any erectile dysfunction, but reports decreased libido.   -Minimal wheezing appreciated today, non at home. States he has Albuterol inhaler at home, but does not use it in a while. Denies any chest pain, dyspnea, or cough.  Chest CT 01/21/24: Centrilobular and paraseptal emphysema. Smoking related respiratory bronchiolitis Difficulty finding right foot pulse today. Denies any right leg pain, numbness, or tingling.   Lab Results  Component Value Date   CHOL 241 (H) 11/18/2021   HDL 42.00 11/18/2021   LDLCALC 164 (H) 11/18/2021   LDLDIRECT 144.0 11/20/2020   TRIG 173.0 (H) 11/18/2021   CHOLHDL 6 11/18/2021   Pt also reports compliance with Rosuvastatin  20 mg once daily for HLD and Atenolol  50 mg once daily for HTN.  Lab Results  Component Value Date   NA 137  09/29/2022   CL 100 09/29/2022   K 4.1 09/29/2022   CO2 29 (A) 09/29/2022   BUN 24 (A) 09/29/2022   CREATININE 1.3 09/29/2022   EGFR 56.0 10/06/2023   CALCIUM  9.6 09/29/2022   ALBUMIN 4.2 09/29/2022   GLUCOSE 130 (H) 12/01/2021   B12 deficiency and hypomagnesemia: Currently he is not on B12 or magnesium  supplementation. Lab Results  Component Value Date   VITAMINB12 706 11/28/2018   Review of Systems  Constitutional:  Negative for activity change, appetite change and fever.  HENT:  Negative for nosebleeds, sore throat and trouble swallowing.   Eyes:  Negative for redness and visual disturbance.  Cardiovascular:  Negative for chest pain and palpitations.  Gastrointestinal:  Negative for abdominal pain, nausea and vomiting.  Endocrine: Negative for cold intolerance and heat intolerance.  Genitourinary:  Negative for decreased urine volume, dysuria and hematuria.  Skin:  Negative for rash.  Neurological:  Negative for syncope, facial asymmetry, weakness and headaches.  Psychiatric/Behavioral:  Negative for confusion, hallucinations and sleep disturbance.   See other pertinent positives and negatives in HPI.  Current Outpatient Medications on File Prior to Visit  Medication Sig Dispense Refill   aspirin EC (ASPIR-LOW) 81 MG tablet      atenolol  (TENORMIN ) 50 MG tablet TAKE 1 TABLET EVERY DAY (DUE FOR FOLLOW UP) 90 tablet 1   dapagliflozin propanediol (FARXIGA) 10 MG TABS tablet      famotidine (PEPCID) 20 MG tablet      rosuvastatin  (CRESTOR ) 20 MG tablet TAKE 1 TABLET EVERY DAY (  DUE FOR FOLLOW UP) 90 tablet 1   sertraline  (ZOLOFT ) 50 MG tablet TAKE 1 TABLET EVERY DAY. DUR FOR FOLLOW UP. 90 tablet 1   No current facility-administered medications on file prior to visit.   Past Medical History:  Diagnosis Date   DEPRESSION 02/05/2010   GERD 02/05/2010   HYPERTENSION 02/05/2010   TOBACCO ABUSE 09/15/2010   Allergies  Allergen Reactions   Oxycodone Nausea And Vomiting    Amlodipine     Prochlorperazine Edisylate     Social History   Socioeconomic History   Marital status: Married    Spouse name: Not on file   Number of children: Not on file   Years of education: Not on file   Highest education level: Some college, no degree  Occupational History   Not on file  Tobacco Use   Smoking status: Every Day    Types: Cigars    Start date: 01/2020   Smokeless tobacco: Never   Tobacco comments:    1 cigar daily for the past year.  Vaping Use   Vaping status: Never Used  Substance and Sexual Activity   Alcohol use: Yes    Alcohol/week: 7.0 standard drinks of alcohol    Types: 7 Standard drinks or equivalent per week   Drug use: No   Sexual activity: Yes    Partners: Female  Other Topics Concern   Not on file  Social History Narrative   Not on file   Social Drivers of Health   Financial Resource Strain: Low Risk  (06/15/2024)   Overall Financial Resource Strain (CARDIA)    Difficulty of Paying Living Expenses: Not very hard  Food Insecurity: No Food Insecurity (06/15/2024)   Hunger Vital Sign    Worried About Running Out of Food in the Last Year: Never true    Ran Out of Food in the Last Year: Never true  Transportation Needs: No Transportation Needs (06/15/2024)   PRAPARE - Administrator, Civil Service (Medical): No    Lack of Transportation (Non-Medical): No  Physical Activity: Insufficiently Active (06/15/2024)   Exercise Vital Sign    Days of Exercise per Week: 1 day    Minutes of Exercise per Session: 30 min  Stress: No Stress Concern Present (06/15/2024)   [employer] of Occupational Health - Occupational Stress Questionnaire    Feeling of Stress: Only a little  Social Connections: Moderately Integrated (06/15/2024)   Social Connection and Isolation Panel    Frequency of Communication with Friends and Family: Twice a week    Frequency of Social Gatherings with Friends and Family: Three times a week    Attends Religious  Services: 1 to 4 times per year    Active Member of Clubs or Organizations: No    Attends Banker Meetings: Not on file    Marital Status: Married    Vitals:   06/16/24 1058  BP: 110/70  Pulse: 64  Resp: 16  SpO2: 95%   Body mass index is 27.05 kg/m.  Physical Exam Vitals and nursing note reviewed.  Constitutional:      General: He is not in acute distress.    Appearance: He is well-developed and well-groomed.  HENT:     Head: Normocephalic and atraumatic.     Mouth/Throat:     Mouth: Mucous membranes are dry.  Eyes:     Conjunctiva/sclera: Conjunctivae normal.  Cardiovascular:     Rate and Rhythm: Normal rate and regular rhythm.  Pulses:          Dorsalis pedis pulses are 2+ on the left side.     Heart sounds: No murmur heard.    Comments: Could not find right DP pulse. Right TP palpable as well as popliteal. Pulmonary:     Effort: Pulmonary effort is normal. No respiratory distress.     Breath sounds: Wheezing (minimal noted at the end of expiration.) present. No decreased breath sounds, rhonchi or rales.  Abdominal:     Palpations: Abdomen is soft. There is no hepatomegaly or mass.     Tenderness: There is no abdominal tenderness.  Musculoskeletal:     Right lower leg: No edema.     Left lower leg: No edema.  Feet:     Right foot:     Skin integrity: No skin breakdown or erythema.  Lymphadenopathy:     Cervical: No cervical adenopathy.  Skin:    General: Skin is warm.     Findings: No erythema or rash.  Neurological:     Mental Status: He is alert and oriented to person, place, and time.     Cranial Nerves: No cranial nerve deficit.     Gait: Gait normal.  Psychiatric:        Mood and Affect: Mood and affect normal.   ASSESSMENT AND PLAN: [name] was seen today for fatigue.  Lab Results  Component Value Date   WBC 6.3 06/16/2024   HGB 15.3 06/16/2024   HCT 46.0 06/16/2024   MCV 91.0 06/16/2024   PLT 220.0 06/16/2024   Lab  Results  Component Value Date   NA 140 06/16/2024   CL 100 06/16/2024   K 4.6 06/16/2024   CO2 31 06/16/2024   BUN 21 06/16/2024   CREATININE 1.33 06/16/2024   GFR 53.44 (L) 06/16/2024   CALCIUM  9.2 06/16/2024   ALBUMIN 3.9 06/16/2024   GLUCOSE 74 06/16/2024   Lab Results  Component Value Date   ALT 18 06/16/2024   AST 18 06/16/2024   ALKPHOS 69 06/16/2024   BILITOT 0.4 06/16/2024   Lab Results  Component Value Date   TESTOSTERONE  294.51 (L) 06/16/2024   Lab Results  Component Value Date   TSH 0.86 06/16/2024   Lab Results  Component Value Date   VITAMINB12 233 06/16/2024   Fatigue, unspecified type We discussed possible etiologies. Some of his chronic comorbidities as well as medication could be contributing factors. Explained that sometimes we are not able to find the cause. Monitor for new symptoms. Further recommendation will be given according to lab results.  -     CBC; Future -     Comprehensive metabolic panel with GFR; Future -     Testosterone ; Future -     TSH; Future  Decreased dorsalis pedis pulse No signs of limb ischemia. Currently on Aspirin 81 mg daily and rosuvastatin  20 mg daily.  ABI will be arranged.  -     VAS US  ABI WITH/WO TBI; Future  B12 deficiency Assessment & Plan: Currently he is not on B12 supplementation. Further recommendation will be given according to B12 result.  Orders: -     Vitamin B12; Future  Hypomagnesemia Assessment & Plan: He is not taking magnesium . Further recommendation will be given according to magnesium  result.  Orders: -     Magnesium ; Future  Depression, major, recurrent, in partial remission (HCC) Assessment & Plan: He reports problem as stable. Symptoms he is reporting today can be related to this  problem. Continue sertraline  50 mg daily for now, if labs are otherwise normal, we can consider adjusting dose or adding a new medication.  Return if symptoms worsen or fail to improve, for keep  next appointment.  I, Vernell Forest, acting as a scribe for Tiwatope Emmitt Sw[place], MD., have documented all relevant documentation on the behalf of Sabine Tenenbaum [place], MD, as directed by   while in the presence of Momo Braun [place], MD.  I, Ival Pacer [place], MD, have reviewed all documentation for this visit. The documentation on 06/16/24 for the exam, diagnosis, procedures, and orders are all accurate and complete.  Jaedah Lords G. [place], MD  [facility]. [place] office.

## 2024-06-16 NOTE — Patient Instructions (Signed)
 A few things to remember from today's visit:  Fatigue, unspecified type - Plan: CBC, Comprehensive metabolic panel with GFR, Testosterone , TSH  Decreased dorsalis pedis pulse - Plan: VAS US  ABI WITH/WO TBI  B12 deficiency - Plan: Vitamin B12  Hypomagnesemia - Plan: Magnesium   Depression, major, recurrent, in partial remission (HCC)  If you need refills for medications you take chronically, please call your pharmacy. Do not use My Chart to request refills or for acute issues that need immediate attention. If you send a my chart message, it may take a few days to be addressed, specially if I am not in the office.  Please be sure medication list is accurate. If a new problem present, please set up appointment sooner than planned today.

## 2024-06-16 NOTE — Assessment & Plan Note (Signed)
 He is not taking magnesium . Further recommendation will be given according to magnesium  result.

## 2024-06-16 NOTE — Assessment & Plan Note (Signed)
Currently he is not on B12 supplementation. Further recommendation will be given according to B12 result.

## 2024-06-22 ENCOUNTER — Ambulatory Visit (HOSPITAL_COMMUNITY)
Admission: RE | Admit: 2024-06-22 | Discharge: 2024-06-22 | Disposition: A | Discharge status: Home / Self Care | Source: Ambulatory Visit | Attending: Family Medicine | Admitting: Family Medicine

## 2024-06-22 ENCOUNTER — Other Ambulatory Visit

## 2024-06-22 ENCOUNTER — Ambulatory Visit

## 2024-06-22 ENCOUNTER — Ambulatory Visit (INDEPENDENT_AMBULATORY_CARE_PROVIDER_SITE_OTHER)

## 2024-06-22 DIAGNOSIS — R0989 Other specified symptoms and signs involving the circulatory and respiratory systems: Secondary | ICD-10-CM | POA: Diagnosis not present

## 2024-06-22 DIAGNOSIS — R7989 Other specified abnormal findings of blood chemistry: Secondary | ICD-10-CM

## 2024-06-22 DIAGNOSIS — E538 Deficiency of other specified B group vitamins: Secondary | ICD-10-CM

## 2024-06-22 DIAGNOSIS — R5383 Other fatigue: Secondary | ICD-10-CM | POA: Diagnosis not present

## 2024-06-22 MED ORDER — CYANOCOBALAMIN 1000 MCG/ML IJ SOLN
1000.0000 ug | Freq: Once | INTRAMUSCULAR | Status: AC
  Administered 2024-06-22: 1000 ug via INTRAMUSCULAR

## 2024-06-22 NOTE — Progress Notes (Signed)
 Patient is in office today for a nurse visit for B12 Injection. Patient Injection was given in the  Left deltoid. Patient tolerated injection well.

## 2024-06-24 LAB — TESTOSTERONE TOTAL,FREE,BIO, MALES
Albumin: 4.2 g/dL (ref 3.6–5.1)
Sex Hormone Binding: 46 nmol/L (ref 22–77)
Testosterone, Bioavailable: 99.1 ng/dL (ref 15.0–150.0)
Testosterone, Free: 51.5 pg/mL (ref 6.0–73.0)
Testosterone: 501 ng/dL (ref 250–827)

## 2024-07-06 ENCOUNTER — Other Ambulatory Visit: Payer: Self-pay | Admitting: Family Medicine

## 2024-07-06 DIAGNOSIS — F3341 Major depressive disorder, recurrent, in partial remission: Secondary | ICD-10-CM

## 2024-07-24 ENCOUNTER — Ambulatory Visit (INDEPENDENT_AMBULATORY_CARE_PROVIDER_SITE_OTHER)

## 2024-07-24 DIAGNOSIS — E538 Deficiency of other specified B group vitamins: Secondary | ICD-10-CM | POA: Diagnosis not present

## 2024-07-24 MED ORDER — CYANOCOBALAMIN 1000 MCG/ML IJ SOLN
1000.0000 ug | Freq: Once | INTRAMUSCULAR | Status: AC
  Administered 2024-07-24: 1000 ug via INTRAMUSCULAR

## 2024-07-24 NOTE — Progress Notes (Signed)
Per orders of Dr. Jordan, injection of Cyanocobalamin 1000 mcg given by Ayumi Wangerin L Miracle Mongillo. Patient tolerated injection well.  

## 2024-08-31 ENCOUNTER — Ambulatory Visit (INDEPENDENT_AMBULATORY_CARE_PROVIDER_SITE_OTHER): Admitting: Family Medicine

## 2024-08-31 ENCOUNTER — Encounter: Payer: Self-pay | Admitting: Family Medicine

## 2024-08-31 ENCOUNTER — Ambulatory Visit: Payer: Self-pay

## 2024-08-31 VITALS — BP 122/80 | HR 56 | Temp 98.6°F | Ht 70.0 in | Wt 186.8 lb

## 2024-08-31 DIAGNOSIS — Z720 Tobacco use: Secondary | ICD-10-CM

## 2024-08-31 DIAGNOSIS — R0789 Other chest pain: Secondary | ICD-10-CM

## 2024-08-31 NOTE — Telephone Encounter (Signed)
 FYI Only or Action Required?: FYI only for provider.  Patient was last seen in primary care on 06/16/2024 by [place], Betty G, MD.  Called Nurse Triage reporting Chest Pain.  Symptoms began today.  Interventions attempted: Rest, hydration, or home remedies.  Symptoms are: completely resolved.  Triage Disposition: See Physician Within 24 Hours  Patient/caregiver understands and will follow disposition?: Yes  **Appt. Scheduled for 10/23 to follow-up**           Copied from CRM #[id number]. Topic: Clinical - Red Word Triage >> Aug 31, 2024 11:11 AM Dedra B wrote: [facility] that prompted transfer to Nurse Triage: Pt woke up last night feeling pressure in his chest. The pressure subsided and he was able to go back to sleep. Warm transfer to NT Reason for Disposition  [1] Chest pain lasts < 5 minutes AND [2] NO chest pain or cardiac symptoms (e.g., breathing difficulty, sweating) now  (Exception: Chest pains that last only a few seconds.)  Answer Assessment - Initial Assessment Questions 1. LOCATION: Where does it hurt?       Not current, he awoke at 2 am this morning, upon sitting he felt pressure in his upper chest, after 10 minutes symptoms resolved   2. RADIATION: Does the pain go anywhere else? (e.g., into neck, jaw, arms, back)     No   3. ONSET: When did the chest pain begin? (Minutes, hours or days)      This morning at 2 am   4. PATTERN: Does the pain come and go, or has it been constant since it started?  Does it get worse with exertion?      Constant   5. DURATION: How long does it last (e.g., seconds, minutes, hours)     5-10 minutes   6. SEVERITY: How bad is the pain?  (e.g., Scale 1-10; mild, moderate, or severe)     Moderate, felt like blocks on his chest    7. CARDIAC RISK FACTORS: Do you have any history of heart problems or risk factors for heart disease? (e.g., angina, prior heart attack; diabetes, high blood pressure, high cholesterol,  smoker, or strong family history of heart disease)     No, but he is unsure if he has a heart attack 6 years ago    8. PULMONARY RISK FACTORS: Do you have any history of lung disease?  (e.g., blood clots in lung, asthma, emphysema, birth control pills)     No  9. CAUSE: What do you think is causing the chest pain?     Unsure   10. OTHER SYMPTOMS: Do you have any other symptoms? (e.g., dizziness, nausea, vomiting, sweating, fever, difficulty breathing, cough)  No other symptoms noted. Symptoms have completely resolved. Appt. Scheduled for 10/23 to follow-up  Protocols used: Chest Pain-A-AH

## 2024-08-31 NOTE — Progress Notes (Signed)
 Established Patient Office Visit   Subjective  Patient ID: Ryan Daniel, male    DOB: 1951/08/09  Age: 73 y.o. MRN: [id number]  Chief Complaint  Patient presents with   Acute Visit    Chest pain started this morning at 2am, patient states I felt like something sitting on my chest  lasted for 10 mins     Pt is a 73 yo male with pmh sig for OSA, Asc Aortic aneurysm, HTN, tobacco use, HLD, gerd, ED who is followed by Dr. [place] and seen for acute concern.  Pt had chest pressure around 2 am after waking up to use the restroom.  Pt sat in the recliner, sx lasted 10 min.  Denies HAs, n/v, heart burn, chest pain, fatigue, diaphoresis, tachycardia during episode.  Takes ASA 81 mg daily.  No recurrence in sx since last night.  Pt smoked cigarettes x 20 yrs, quit 10-15 yrs ago, now smoking 2 cigars per day x last 4 yrs.  Per chart review on Farxiga due to CKD 3.    Patient Active Problem List   Diagnosis Date Noted   Ascending aortic aneurysm 03/29/2024   OSA (obstructive sleep apnea) 02/09/2023   Snoring 08/11/2022   CKD (chronic kidney disease), stage III (HCC) 07/22/2022   Erectile dysfunction 08/21/2020   Hypomagnesemia 11/28/2018   Hyperlipidemia, mixed 11/28/2018   B12 deficiency 02/23/2017   TOBACCO ABUSE 09/15/2010   Depression, major, recurrent, in partial remission 02/05/2010   Essential hypertension 02/05/2010   GERD 02/05/2010   Past Medical History:  Diagnosis Date   DEPRESSION 02/05/2010   GERD 02/05/2010   HYPERTENSION 02/05/2010   TOBACCO ABUSE 09/15/2010   History reviewed. No pertinent surgical history. Social History   Tobacco Use   Smoking status: Every Day    Types: Cigars    Start date: 01/2020   Smokeless tobacco: Never   Tobacco comments:    1 cigar daily for the past year.  Vaping Use   Vaping status: Never Used  Substance Use Topics   Alcohol use: Yes    Alcohol/week: 7.0 standard drinks of alcohol    Types: 7 Standard drinks or equivalent per week    Drug use: No   Family History  Problem Relation Age of Onset   Cancer Maternal Grandmother        colon   Heart disease Paternal Grandfather    Allergies  Allergen Reactions   Oxycodone Nausea And Vomiting   Amlodipine     Prochlorperazine Edisylate     ROS Negative unless stated above    Objective:     BP 122/80 (BP Location: Left Arm, Patient Position: Sitting, Cuff Size: Large)   Pulse (!) 56   Temp 98.6 F (37 C) (Oral)   Ht 5' 10 (1.778 m)   Wt 186 lb 12.8 oz (84.7 kg)   SpO2 92%   BMI 26.80 kg/m  BP Readings from Last 3 Encounters:  08/31/24 122/80  06/16/24 110/70  12/22/23 130/80   Wt Readings from Last 3 Encounters:  08/31/24 186 lb 12.8 oz (84.7 kg)  06/16/24 188 lb 8 oz (85.5 kg)  12/22/23 208 lb 2 oz (94.4 kg)    .p  Physical Exam Constitutional:      General: He is not in acute distress.    Appearance: Normal appearance.  HENT:     Head: Normocephalic and atraumatic.     Nose: Nose normal.     Mouth/Throat:     Mouth: Mucous membranes are  moist.  Cardiovascular:     Rate and Rhythm: Normal rate and regular rhythm.     Heart sounds: Normal heart sounds. No murmur heard.    No gallop.  Pulmonary:     Effort: Pulmonary effort is normal. No respiratory distress.     Breath sounds: Normal breath sounds. No wheezing, rhonchi or rales.  Skin:    General: Skin is warm and dry.  Neurological:     Mental Status: He is alert and oriented to person, place, and time.        12/30/2023    3:47 PM 12/23/2023    9:23 PM 07/22/2022   10:50 AM  Depression screen PHQ 2/9  Decreased Interest 0 0 0  Down, Depressed, Hopeless 0 0 0  PHQ - 2 Score 0 0 0  Altered sleeping  0 0  Tired, decreased energy  0 0  Change in appetite  0 0  Feeling bad or failure about yourself   0 0  Trouble concentrating  0 0  Moving slowly or fidgety/restless  0 0  Suicidal thoughts  0 0  PHQ-9 Score  0 0  Difficult doing work/chores  Not difficult at all Not  difficult at all      11/20/2020    9:34 AM  GAD 7 : Generalized Anxiety Score  Nervous, Anxious, on Edge 0  Control/stop worrying 0  Worry too much - different things 0  Trouble relaxing 0  Restless 0  Easily annoyed or irritable 0  Afraid - awful might happen 0  Total GAD 7 Score 0  Anxiety Difficulty Not difficult at all     No results found for any visits on 08/31/24.    Assessment & Plan:   Chest pressure -     EKG 12-Lead -     Lipid panel; Future -     Comprehensive metabolic panel with GFR; Future -     Magnesium ; Future -     Troponin I -; Future -     Ambulatory referral to Cardiology  Tobacco use  Acute episode of chest pressure. VS stable.  Discussed possible causes including  angina, GERD, worsened aneurysm, pulmonary etiology such as COPD/emphysema.  EKG in clinic sinus bradycardia.  No ST elevation, T wave inversion or other alarming signs.  Previous EKG reviewed for comparison.  Low-dose CT chest for lung cancer screening on 01/05/2024 reviewed.  A 4.1 cm ascending aortic aneurysm stable, aortic atherosclerosis, emphysema noted.  Obtain labs.  Referral to cardiology placed for stress test.  Given strict precautions.  Smoking cessation counseling greater than 3 minutes, less than 10.  Cessation strongly encouraged.  Patient is not ready to quit.  I personally spent a total of 41 minutes in the care of the patient today including preparing to see the patient, getting/reviewing separately obtained history, performing a medically appropriate exam/evaluation, counseling and educating, placing orders, referring and communicating with other health care professionals, documenting clinical information in the EHR, independently interpreting results, communicating results, and coordinating care.  Return if symptoms worsen or fail to improve.   Clotilda JONELLE Single, MD

## 2024-09-01 LAB — LIPID PANEL
Cholesterol: 101 mg/dL (ref 0–200)
HDL: 31.9 mg/dL — ABNORMAL LOW (ref >39.00)
LDL Cholesterol: 49 mg/dL (ref 0–99)
NonHDL: 69.36
Total CHOL/HDL Ratio: 3
Triglycerides: 102 mg/dL (ref 0.0–149.0)
VLDL: 20.4 mg/dL (ref 0.0–40.0)

## 2024-09-01 LAB — COMPREHENSIVE METABOLIC PANEL WITH GFR
ALT: 28 U/L (ref 0–53)
AST: 29 U/L (ref 0–37)
Albumin: 4.1 g/dL (ref 3.5–5.2)
Alkaline Phosphatase: 71 U/L (ref 39–117)
BUN: 21 mg/dL (ref 6–23)
CO2: 23 meq/L (ref 19–32)
Calcium: 9.2 mg/dL (ref 8.4–10.5)
Chloride: 106 meq/L (ref 96–112)
Creatinine, Ser: 1.18 mg/dL (ref 0.40–1.50)
GFR: 61.6 mL/min (ref >60.00)
Glucose, Bld: 70 mg/dL (ref 70–99)
Potassium: 4.6 meq/L (ref 3.5–5.1)
Sodium: 138 meq/L (ref 135–145)
Total Bilirubin: 0.4 mg/dL (ref 0.2–1.2)
Total Protein: 7.5 g/dL (ref 6.0–8.3)

## 2024-09-01 LAB — MAGNESIUM: Magnesium: 1.8 mg/dL (ref 1.5–2.5)

## 2024-09-01 LAB — TROPONIN I: Troponin I: 6 ng/L (ref <=47)

## 2024-09-04 ENCOUNTER — Ambulatory Visit: Payer: Self-pay | Admitting: Family Medicine

## 2024-09-26 DIAGNOSIS — N1832 Chronic kidney disease, stage 3b: Secondary | ICD-10-CM | POA: Diagnosis not present

## 2024-10-03 DIAGNOSIS — E785 Hyperlipidemia, unspecified: Secondary | ICD-10-CM | POA: Diagnosis not present

## 2024-10-03 DIAGNOSIS — E669 Obesity, unspecified: Secondary | ICD-10-CM | POA: Diagnosis not present

## 2024-10-03 DIAGNOSIS — I129 Hypertensive chronic kidney disease with stage 1 through stage 4 chronic kidney disease, or unspecified chronic kidney disease: Secondary | ICD-10-CM | POA: Diagnosis not present

## 2024-10-03 DIAGNOSIS — N1832 Chronic kidney disease, stage 3b: Secondary | ICD-10-CM | POA: Diagnosis not present

## 2024-10-03 DIAGNOSIS — R809 Proteinuria, unspecified: Secondary | ICD-10-CM | POA: Diagnosis not present

## 2024-10-03 DIAGNOSIS — E875 Hyperkalemia: Secondary | ICD-10-CM | POA: Diagnosis not present

## 2024-10-17 ENCOUNTER — Ambulatory Visit: Attending: Cardiology | Admitting: Cardiology

## 2024-10-17 ENCOUNTER — Encounter: Payer: Self-pay | Admitting: Cardiology

## 2024-10-17 ENCOUNTER — Ambulatory Visit: Admitting: Adult Health

## 2024-10-17 VITALS — BP 136/68 | HR 60 | Resp 16 | Ht 70.0 in | Wt 184.0 lb

## 2024-10-17 DIAGNOSIS — R072 Precordial pain: Secondary | ICD-10-CM | POA: Diagnosis not present

## 2024-10-17 DIAGNOSIS — G4733 Obstructive sleep apnea (adult) (pediatric): Secondary | ICD-10-CM | POA: Diagnosis not present

## 2024-10-17 DIAGNOSIS — I77810 Thoracic aortic ectasia: Secondary | ICD-10-CM | POA: Diagnosis not present

## 2024-10-17 DIAGNOSIS — I10 Essential (primary) hypertension: Secondary | ICD-10-CM | POA: Diagnosis not present

## 2024-10-17 DIAGNOSIS — R0789 Other chest pain: Secondary | ICD-10-CM

## 2024-10-17 DIAGNOSIS — N1832 Chronic kidney disease, stage 3b: Secondary | ICD-10-CM | POA: Diagnosis not present

## 2024-10-17 DIAGNOSIS — E782 Mixed hyperlipidemia: Secondary | ICD-10-CM | POA: Diagnosis not present

## 2024-10-17 NOTE — Patient Instructions (Addendum)
 Medication Instructions:  No changes   *If you need a refill on your cardiac medications before your next appointment, please call your pharmacy*   Lab Work: Not needed If you have labs (blood work) drawn today and your tests are completely normal, you will receive your results only by: MyChart Message (if you have MyChart) OR A paper copy in the mail If you have any lab test that is abnormal or we need to change your treatment, we will call you to review the results.   Testing/Procedures: Your physician has requested that you have coronary  CTA. Coronary computed tomography (CT)angiogram  is a special type of CT scan that uses a computer to produce multi-dimensional views of major blood vessels throughout the heart.  CT angiography, a contrast material is injected through an IV to help visualize the blood vessels  a painless test that uses an x-ray machine to take clear, detailed pictures of your heart arteries .  Please follow instruction sheet as given.    Follow-Up: At [facility], you and your health needs are our priority.  As part of our continuing mission to provide you with exceptional heart care, we have created designated Provider Care Teams.  These Care Teams include your primary Cardiologist (physician) and Advanced Practice Providers (APPs -  Physician Assistants and Nurse Practitioners) who all work together to provide you with the care you need, when you need it.     Your next appointment:   1 to 2 month(s)  The format for your next appointment:   In Person  Provider:      Damien Braver NP  , Rosabel Mose NP , Lum Louis NP or Alm Clay, MD   Other Instructions     Your cardiac CT will be scheduled at the below locations:     [facility] [address]  [place], [place] [address]     If scheduled at the [facility] at [name], please enter the parking lot using the [name] entrance and  use the FREE valet service at the patient drop-off area. Enter the building and check-in with registration on the main floor.  Please follow these instructions carefully (unless otherwise directed):  An IV will be required for this test and Nitroglycerin will be given.  Hold all erectile dysfunction medications at least 3 days (72 hrs) prior to test. (Ie viagra , cialis, sildenafil , tadalafil, etc)   On the Night Before the Test: Be sure to Drink plenty of water.  Drink 2 bottle water  the evening before Do not consume any caffeinated/decaffeinated beverages or chocolate 12 hours prior to your test. Do not take any antihistamines 12 hours prior to your test.   On the Day of the Test: Drink plenty of water until 1 hour prior to the test. Drink 2 bottle water  the day of  Do not eat any food 1 hour prior to test. You may take your regular medications prior to the test.  Take your  Atenolol  (2  tablets )two hours prior to test. If you take Furosemide/Hydrochlorothiazide /Spironolactone /Chlorthalidone, please HOLD on the morning of the test.         After the Test: Drink plenty of water.( Drink 2 bottle water  the day after )  After receiving IV contrast, you may experience a mild flushed feeling. This is normal. On occasion, you may experience a mild rash up to 24 hours after the test. This is not dangerous. If  this occurs, you can take Benadryl 25 mg, Zyrtec, Claritin, or Allegra and increase your fluid intake. (Patients taking Tikosyn should avoid Benadryl, and may take Zyrtec, Claritin, or Allegra) If you experience trouble breathing, this can be serious. If it is severe call 911 IMMEDIATELY. If it is mild, please call our office.  We will call to schedule your test 2-4 weeks out understanding that some insurance companies will need an authorization prior to the service being performed.   For more information and frequently asked questions, please visit our website :  [email or web address]  For non-scheduling related questions, please contact the cardiac imaging nurse navigator should you have any questions/concerns: Cardiac Imaging Nurse Navigators Direct Office Dial: [phone]   For scheduling needs, including cancellations and rescheduling, please call Brittany, [phone].

## 2024-10-17 NOTE — Progress Notes (Unsigned)
 Cardiology Office Note:  .   Date:  10/17/2024  ID:  Ryan Daniel, DOB 14-Apr-1951, MRN [id number] PCP: Jordan, Betty G, MD  [facility] Providers Cardiologist:  Ryan Clay, MD { Click to update primary MD,subspecialty MD or APP then REFRESH:1}    Chief Complaint  Patient presents with   Chest Pain    Patient Profile: .     Ryan Daniel is a *** 73 y.o. male *** with a PMH notable for *** who presents here for *** at the request of Jordan, Dickey MATSU, MD.  {There is no content from the last Narrative History section.}      Ryan Daniel was last seen on ***  Subjective  Discussed the use of AI scribe software for clinical note transcription with the patient, who gave verbal consent to proceed.  History of Present Illness      Cardiovascular ROS: {roscv:310661}  ROS:  Review of Systems - {ros master:310782}    Objective   Pt smoked cigarettes x 20 yrs, quit 10-15 yrs ago, now smoking 2 cigars per day x last 4 yrs.   Studies Reviewed: SABRA       Lab Results  Component Value Date   CHOL 101 08/31/2024   HDL 31.90 (L) 08/31/2024   LDLCALC 49 08/31/2024   LDLDIRECT 144.0 11/20/2020   TRIG 102.0 08/31/2024   CHOLHDL 3 08/31/2024   Lab Results  Component Value Date   NA 138 08/31/2024   K 4.6 08/31/2024   CREATININE 1.18 08/31/2024   GFR 61.60 08/31/2024   GLUCOSE 70 08/31/2024   No results found for: HGBA1C Mag 2.0 (06/2024)  Results  ABIs: 06/22/2024:  Right: Resting right ankle-brachial index is within normal range. The right toe-brachial index is abnormal.  Left: Resting left ankle-brachial index is within normal range. The left toe-brachial index is normal.  CT Chest : (01/2024) 1. Lung-RADS 1, negative. Continue annual screening with low-dose chest CT without contrast in 12 months. 2. 4.1 cm ascending aortic aneurysm, stable. Recommend annual imaging followup by CTA or MRA.  3.  Aortic atherosclerosis  4.  Emphysema   Risk  Assessment/Calculations:              Physical Exam:   VS:  BP 136/68   Pulse 60   Resp 16   Ht 5' 10 (1.778 m)   Wt 184 lb (83.5 kg)   SpO2 99%   BMI 26.40 kg/m    Wt Readings from Last 3 Encounters:  10/17/24 184 lb (83.5 kg)  08/31/24 186 lb 12.8 oz (84.7 kg)  06/16/24 188 lb 8 oz (85.5 kg)    GEN: Well nourished, well developed in no acute distress; *** NECK: No JVD; No carotid bruits CARDIAC: Normal S1, S2; RRR, no murmurs, rubs, gallops RESPIRATORY:  Clear to auscultation without rales, wheezing or rhonchi ; nonlabored, good air movement. ABDOMEN: Soft, non-tender, non-distended EXTREMITIES:  No edema; No deformity      ASSESSMENT AND PLAN: .    Problem List Items Addressed This Visit   None Visit Diagnoses       Chest pressure    -  Primary   Relevant Orders   EKG 12-Lead (Completed)       Assessment and Plan Assessment & Plan        {Are you ordering a CV Procedure (e.Daniel. stress test, cath, DCCV, TEE, etc)?   Press F2        :[id number]}   Follow-Up: No follow-ups on  file.  I spent *** minutes in the care of Ryan Daniel today including {CHL AMB CAR Time Based Billing Options STW (Optional):[id number]::documenting in the encounter.}      Signed, Ryan MICAEL Clay, MD, MS Ryan Daniel, M.D., M.S. Interventional Cardiologist  [facility] Pager # [phone]

## 2024-10-19 ENCOUNTER — Encounter: Payer: Self-pay | Admitting: Cardiology

## 2024-10-19 DIAGNOSIS — R072 Precordial pain: Secondary | ICD-10-CM | POA: Insufficient documentation

## 2024-10-19 NOTE — Assessment & Plan Note (Signed)
 Recent creatinine level of 1.36 with proteinuria indicating impaired kidney filtration. Emphasized hydration to protect kidney function, especially with upcoming CT scan with contrast. - Ensure adequate hydration, especially around the time of CT scan with contrast.

## 2024-10-19 NOTE — Assessment & Plan Note (Addendum)
 Evaluation for coronary artery disease Intermittent chest discomfort with risk factors for coronary artery disease. Differential includes coronary artery disease versus gastrointestinal etiology. CT scan recommended for definitive assessment. - Ordered Coronary CTA to assess for coronary artery disease. - Checked chemistry panel to ensure kidney function is adequate for CT scan. - Advised to drink two extra bottles of water the day before, the day of, and the day after the CT scan to protect kidney function.

## 2024-10-19 NOTE — Assessment & Plan Note (Signed)
 Hypertension well controlled with atenolol . Home blood pressure readings around 125/85-86 mmHg. - Continue current antihypertensive regimen with atenolol  50 mg daily

## 2024-10-19 NOTE — Assessment & Plan Note (Signed)
 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 Regular monitoring with CT scans is in place. - Continue regular monitoring with CT scans to assess changes in aortic dilation.

## 2024-10-19 NOTE — Assessment & Plan Note (Signed)
 Well controlled with rosuvastatin  20 mg daily with LDL of 49. Recent weight loss and medication have improved cholesterol levels. - Continue rosuvastatin  for cholesterol management.

## 2024-10-19 NOTE — Assessment & Plan Note (Signed)
 Severe obstructive sleep apnea diagnosed via sleep study. Currently using CPAP therapy. - Continue CPAP therapy for management of obstructive sleep apnea.

## 2024-11-07 NOTE — Addendum Note (Signed)
 Addended by: GLADIS REENA GAILS on: 11/07/2024 12:15 PM   Modules accepted: Orders

## 2024-11-15 ENCOUNTER — Telehealth (HOSPITAL_COMMUNITY): Payer: Self-pay | Admitting: Emergency Medicine

## 2024-11-15 NOTE — Telephone Encounter (Signed)
 Reaching out to patient to offer assistance regarding upcoming cardiac imaging study; pt verbalizes understanding of appt date/time, parking situation and where to check in, pre-test NPO status and medications ordered, and verified current allergies; name and call back number provided for further questions should they arise Rockwell Alexandria RN Navigator Cardiac Imaging [facility] [phone] office [phone] cell

## 2024-11-16 ENCOUNTER — Ambulatory Visit (HOSPITAL_COMMUNITY)
Admission: RE | Admit: 2024-11-16 | Discharge: 2024-11-16 | Disposition: A | Discharge status: Home / Self Care | Source: Ambulatory Visit | Attending: Cardiology | Admitting: Cardiology

## 2024-11-16 VITALS — BP 153/79 | HR 50

## 2024-11-16 DIAGNOSIS — N1832 Chronic kidney disease, stage 3b: Secondary | ICD-10-CM | POA: Diagnosis present

## 2024-11-16 DIAGNOSIS — R072 Precordial pain: Secondary | ICD-10-CM | POA: Insufficient documentation

## 2024-11-16 LAB — POCT I-STAT CREATININE: Creatinine, Ser: 1.4 mg/dL — ABNORMAL HIGH (ref 0.61–1.24)

## 2024-11-16 MED ORDER — NITROGLYCERIN 0.4 MG SL SUBL
0.8000 mg | SUBLINGUAL_TABLET | Freq: Once | SUBLINGUAL | Status: AC
  Administered 2024-11-16: 0.8 mg via SUBLINGUAL

## 2024-11-16 MED ORDER — IOHEXOL 350 MG/ML SOLN
100.0000 mL | Freq: Once | INTRAVENOUS | Status: AC | PRN
  Administered 2024-11-16: 100 mL via INTRAVENOUS

## 2024-11-19 ENCOUNTER — Ambulatory Visit: Payer: Self-pay | Admitting: Cardiology

## 2024-12-08 ENCOUNTER — Encounter: Payer: Self-pay | Admitting: *Deleted

## 2024-12-11 ENCOUNTER — Ambulatory Visit: Admitting: Emergency Medicine
